# Patient Record
Sex: Female | Born: 1975 | Race: Black or African American | Hispanic: No | Marital: Married | Smoking: Never smoker
Health system: Southern US, Community
[De-identification: ages and names within clinical notes are randomized; demographics above are authoritative.]

## PROBLEM LIST (undated history)

## (undated) ENCOUNTER — Inpatient Hospital Stay (HOSPITAL_COMMUNITY): Payer: Self-pay

## (undated) DIAGNOSIS — O139 Gestational [pregnancy-induced] hypertension without significant proteinuria, unspecified trimester: Secondary | ICD-10-CM

## (undated) DIAGNOSIS — O09299 Supervision of pregnancy with other poor reproductive or obstetric history, unspecified trimester: Secondary | ICD-10-CM

## (undated) HISTORY — DX: Supervision of pregnancy with other poor reproductive or obstetric history, unspecified trimester: O09.299

## (undated) HISTORY — PX: NO PAST SURGERIES: SHX2092

---

## 2009-07-03 ENCOUNTER — Inpatient Hospital Stay (HOSPITAL_COMMUNITY): Admission: AD | Admit: 2009-07-03 | Discharge: 2009-07-09 | Payer: Self-pay | Admitting: Obstetrics

## 2009-07-04 ENCOUNTER — Encounter: Payer: Self-pay | Admitting: Obstetrics

## 2009-07-06 ENCOUNTER — Encounter (INDEPENDENT_AMBULATORY_CARE_PROVIDER_SITE_OTHER): Payer: Self-pay | Admitting: Obstetrics

## 2009-07-08 ENCOUNTER — Encounter: Payer: Self-pay | Admitting: Neurology

## 2009-07-11 ENCOUNTER — Encounter: Admission: RE | Admit: 2009-07-11 | Discharge: 2009-08-10 | Payer: Self-pay | Admitting: Obstetrics

## 2010-04-21 LAB — CREATININE CLEARANCE, URINE, 24 HOUR
Collection Interval-CRCL: 24 h
Creatinine Clearance: 171 mL/min — ABNORMAL HIGH (ref 75–115)
Creatinine, 24H Ur: 1645 mg/d (ref 700–1800)
Creatinine, Urine: 65.8 mg/dL
Creatinine: 0.67 mg/dL (ref 0.4–1.2)
Urine Total Volume-CRCL: 2500 mL

## 2010-04-21 LAB — COMPREHENSIVE METABOLIC PANEL WITH GFR
ALT: 12 U/L (ref 0–35)
ALT: 13 U/L (ref 0–35)
ALT: 16 U/L (ref 0–35)
AST: 18 U/L (ref 0–37)
AST: 19 U/L (ref 0–37)
AST: 21 U/L (ref 0–37)
Albumin: 2.4 g/dL — ABNORMAL LOW (ref 3.5–5.2)
Albumin: 2.4 g/dL — ABNORMAL LOW (ref 3.5–5.2)
Albumin: 2.5 g/dL — ABNORMAL LOW (ref 3.5–5.2)
Alkaline Phosphatase: 106 U/L (ref 39–117)
Alkaline Phosphatase: 113 U/L (ref 39–117)
Alkaline Phosphatase: 116 U/L (ref 39–117)
BUN: 5 mg/dL — ABNORMAL LOW (ref 6–23)
BUN: 6 mg/dL (ref 6–23)
BUN: 7 mg/dL (ref 6–23)
CO2: 20 meq/L (ref 19–32)
CO2: 21 meq/L (ref 19–32)
CO2: 26 meq/L (ref 19–32)
Calcium: 7.2 mg/dL — ABNORMAL LOW (ref 8.4–10.5)
Calcium: 8.2 mg/dL — ABNORMAL LOW (ref 8.4–10.5)
Calcium: 8.8 mg/dL (ref 8.4–10.5)
Chloride: 101 meq/L (ref 96–112)
Chloride: 104 meq/L (ref 96–112)
Chloride: 99 meq/L (ref 96–112)
Creatinine, Ser: 0.61 mg/dL (ref 0.4–1.2)
Creatinine, Ser: 0.61 mg/dL (ref 0.4–1.2)
Creatinine, Ser: 0.73 mg/dL (ref 0.4–1.2)
GFR calc non Af Amer: >60 mL/min
GFR calc non Af Amer: >60 mL/min
GFR calc non Af Amer: >60 mL/min
Glucose, Bld: 117 mg/dL — ABNORMAL HIGH (ref 70–99)
Glucose, Bld: 130 mg/dL — ABNORMAL HIGH (ref 70–99)
Glucose, Bld: 95 mg/dL (ref 70–99)
Potassium: 3.9 meq/L (ref 3.5–5.1)
Potassium: 3.9 meq/L (ref 3.5–5.1)
Potassium: 4 meq/L (ref 3.5–5.1)
Sodium: 129 meq/L — ABNORMAL LOW (ref 135–145)
Sodium: 131 meq/L — ABNORMAL LOW (ref 135–145)
Sodium: 135 meq/L (ref 135–145)
Total Bilirubin: 0.2 mg/dL — ABNORMAL LOW (ref 0.3–1.2)
Total Bilirubin: 0.2 mg/dL — ABNORMAL LOW (ref 0.3–1.2)
Total Bilirubin: 0.3 mg/dL (ref 0.3–1.2)
Total Protein: 5.6 g/dL — ABNORMAL LOW (ref 6.0–8.3)
Total Protein: 6 g/dL (ref 6.0–8.3)
Total Protein: 6.1 g/dL (ref 6.0–8.3)

## 2010-04-21 LAB — COMPREHENSIVE METABOLIC PANEL
ALT: 12 U/L (ref 0–35)
ALT: 14 U/L (ref 0–35)
ALT: 15 U/L (ref 0–35)
AST: 19 U/L (ref 0–37)
Albumin: 2.1 g/dL — ABNORMAL LOW (ref 3.5–5.2)
Albumin: 2.3 g/dL — ABNORMAL LOW (ref 3.5–5.2)
Alkaline Phosphatase: 87 U/L (ref 39–117)
Alkaline Phosphatase: 99 U/L (ref 39–117)
BUN: 6 mg/dL (ref 6–23)
CO2: 24 mEq/L (ref 19–32)
CO2: 25 mEq/L (ref 19–32)
Calcium: 7.8 mg/dL — ABNORMAL LOW (ref 8.4–10.5)
Calcium: 8.6 mg/dL (ref 8.4–10.5)
Chloride: 102 mEq/L (ref 96–112)
Chloride: 99 mEq/L (ref 96–112)
Creatinine, Ser: 0.72 mg/dL (ref 0.4–1.2)
GFR calc Af Amer: >60 mL/min (ref >60)
GFR calc Af Amer: >60 mL/min (ref >60)
GFR calc Af Amer: >60 mL/min (ref >60)
GFR calc non Af Amer: >60 mL/min (ref >60)
GFR calc non Af Amer: >60 mL/min (ref >60)
Glucose, Bld: 105 mg/dL — ABNORMAL HIGH (ref 70–99)
Glucose, Bld: 139 mg/dL — ABNORMAL HIGH (ref 70–99)
Potassium: 4.2 mEq/L (ref 3.5–5.1)
Sodium: 132 mEq/L — ABNORMAL LOW (ref 135–145)
Sodium: 135 mEq/L (ref 135–145)
Total Bilirubin: 0.1 mg/dL — ABNORMAL LOW (ref 0.3–1.2)
Total Bilirubin: 0.3 mg/dL (ref 0.3–1.2)
Total Protein: 6.1 g/dL (ref 6.0–8.3)

## 2010-04-21 LAB — PROTEIN, URINE, 24 HOUR
Collection Interval-UPROT: 24 h
Protein, 24H Urine: 6475 mg/d — ABNORMAL HIGH (ref 50–100)
Protein, Urine: 259 mg/dL
Urine Total Volume-UPROT: 2500 mL

## 2010-04-21 LAB — CBC
HCT: 33 % — ABNORMAL LOW (ref 36.0–46.0)
HCT: 35.6 % — ABNORMAL LOW (ref 36.0–46.0)
HCT: 36.6 % (ref 36.0–46.0)
HCT: 37.8 % (ref 36.0–46.0)
Hemoglobin: 11.6 g/dL — ABNORMAL LOW (ref 12.0–15.0)
Hemoglobin: 12.4 g/dL (ref 12.0–15.0)
MCHC: 33.6 g/dL (ref 30.0–36.0)
MCHC: 33.8 g/dL (ref 30.0–36.0)
MCHC: 33.9 g/dL (ref 30.0–36.0)
MCV: 83.3 fL (ref 78.0–100.0)
MCV: 83.5 fL (ref 78.0–100.0)
MCV: 83.6 fL (ref 78.0–100.0)
MCV: 83.9 fL (ref 78.0–100.0)
Platelets: 191 10*3/uL (ref 150–400)
Platelets: 218 10*3/uL (ref 150–400)
Platelets: 224 10*3/uL (ref 150–400)
Platelets: 249 K/uL (ref 150–400)
RBC: 3.95 MIL/uL (ref 3.87–5.11)
RBC: 4.01 MIL/uL (ref 3.87–5.11)
RBC: 4.28 MIL/uL (ref 3.87–5.11)
RBC: 4.36 MIL/uL (ref 3.87–5.11)
RDW: 14.9 % (ref 11.5–15.5)
RDW: 15.1 % (ref 11.5–15.5)
RDW: 15.4 % (ref 11.5–15.5)
RDW: 15.5 % (ref 11.5–15.5)
RDW: 15.6 % — ABNORMAL HIGH (ref 11.5–15.5)
WBC: 13.9 10*3/uL — ABNORMAL HIGH (ref 4.0–10.5)
WBC: 8.7 K/uL (ref 4.0–10.5)

## 2010-04-21 LAB — LACTATE DEHYDROGENASE
LDH: 143 U/L (ref 94–250)
LDH: 160 U/L (ref 94–250)
LDH: 171 U/L (ref 94–250)
LDH: 178 U/L (ref 94–250)
LDH: 180 U/L (ref 94–250)

## 2010-04-21 LAB — URIC ACID
Uric Acid, Serum: 6.4 mg/dL (ref 2.4–7.0)
Uric Acid, Serum: 6.5 mg/dL (ref 2.4–7.0)
Uric Acid, Serum: 6.6 mg/dL (ref 2.4–7.0)
Uric Acid, Serum: 6.8 mg/dL (ref 2.4–7.0)
Uric Acid, Serum: 7.1 mg/dL — ABNORMAL HIGH (ref 2.4–7.0)

## 2010-04-21 LAB — MAGNESIUM
Magnesium: 5.7 mg/dL — ABNORMAL HIGH (ref 1.5–2.5)
Magnesium: 6.8 mg/dL (ref 1.5–2.5)

## 2010-04-21 LAB — GLUCOSE, CAPILLARY: Glucose-Capillary: 138 mg/dL — ABNORMAL HIGH (ref 70–99)

## 2010-05-21 ENCOUNTER — Ambulatory Visit (HOSPITAL_COMMUNITY)
Admission: RE | Admit: 2010-05-21 | Discharge: 2010-05-21 | Disposition: A | Discharge status: Home / Self Care | Payer: Self-pay | Source: Ambulatory Visit | Attending: Internal Medicine | Admitting: Internal Medicine

## 2010-05-21 ENCOUNTER — Other Ambulatory Visit (HOSPITAL_COMMUNITY): Payer: Self-pay | Admitting: Internal Medicine

## 2010-05-21 DIAGNOSIS — M25569 Pain in unspecified knee: Secondary | ICD-10-CM

## 2010-06-04 ENCOUNTER — Ambulatory Visit: Payer: Self-pay | Attending: Internal Medicine | Admitting: Physical Therapy

## 2010-06-04 DIAGNOSIS — IMO0001 Reserved for inherently not codable concepts without codable children: Secondary | ICD-10-CM | POA: Insufficient documentation

## 2010-06-04 DIAGNOSIS — M25669 Stiffness of unspecified knee, not elsewhere classified: Secondary | ICD-10-CM | POA: Insufficient documentation

## 2010-06-04 DIAGNOSIS — M25569 Pain in unspecified knee: Secondary | ICD-10-CM | POA: Insufficient documentation

## 2010-06-13 ENCOUNTER — Ambulatory Visit: Payer: Self-pay | Admitting: Physical Therapy

## 2010-06-18 ENCOUNTER — Ambulatory Visit: Payer: Self-pay | Admitting: Physical Therapy

## 2010-07-23 ENCOUNTER — Ambulatory Visit: Payer: Self-pay | Admitting: Physical Therapy

## 2010-07-25 ENCOUNTER — Other Ambulatory Visit (HOSPITAL_COMMUNITY): Payer: Self-pay | Admitting: Neurosurgery

## 2010-07-25 ENCOUNTER — Ambulatory Visit (HOSPITAL_COMMUNITY)
Admission: RE | Admit: 2010-07-25 | Discharge: 2010-07-25 | Disposition: A | Discharge status: Home / Self Care | Payer: Self-pay | Source: Ambulatory Visit | Attending: Neurosurgery | Admitting: Neurosurgery

## 2010-07-25 DIAGNOSIS — R52 Pain, unspecified: Secondary | ICD-10-CM

## 2010-07-25 DIAGNOSIS — M47817 Spondylosis without myelopathy or radiculopathy, lumbosacral region: Secondary | ICD-10-CM | POA: Insufficient documentation

## 2010-07-25 DIAGNOSIS — M545 Low back pain, unspecified: Secondary | ICD-10-CM | POA: Insufficient documentation

## 2011-01-19 ENCOUNTER — Other Ambulatory Visit: Payer: Self-pay | Admitting: Family Medicine

## 2011-05-18 IMAGING — US US OB COMP +14 WK
1 series · 14 of 28 positions shown · non-contrast
Comparison: none

OBSTETRICAL ULTRASOUND:
 This ultrasound was performed in The [HOSPITAL], and the [facility] report will be stored to [REDACTED] PACS.  This report is also available in [HOSPITAL]?s accessANYware.

[Series 1: us ob comp +14 wk · 14 of 39 slices shown]
[im 2/39]
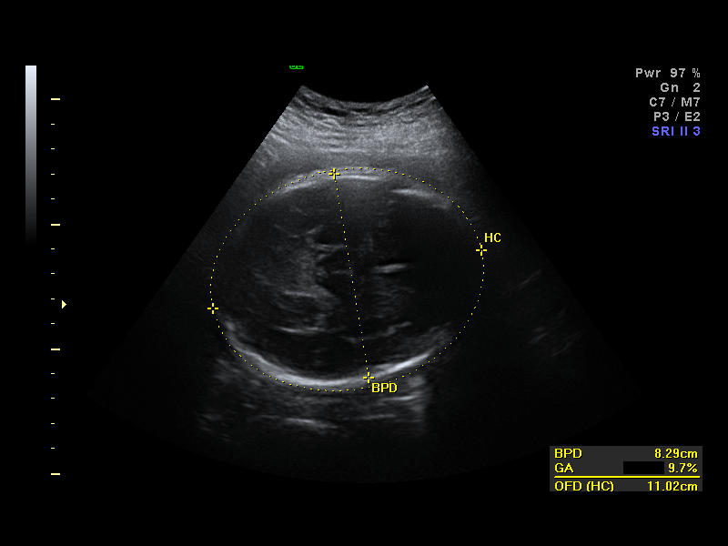
[im 5/39]
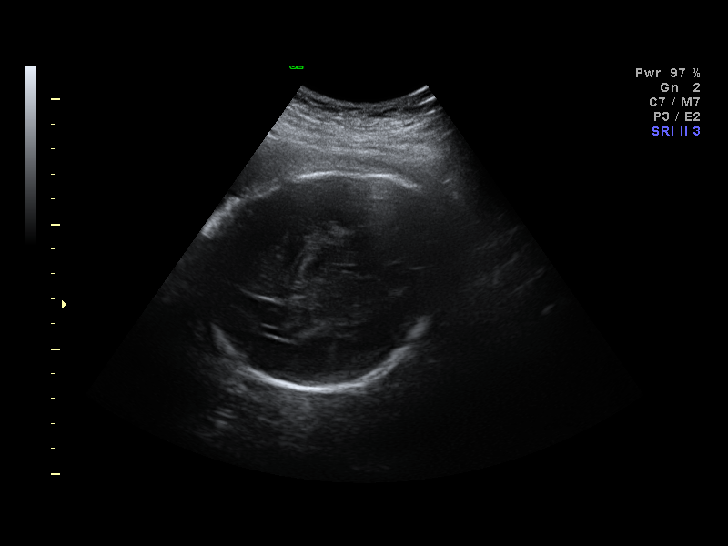
[im 8/39]
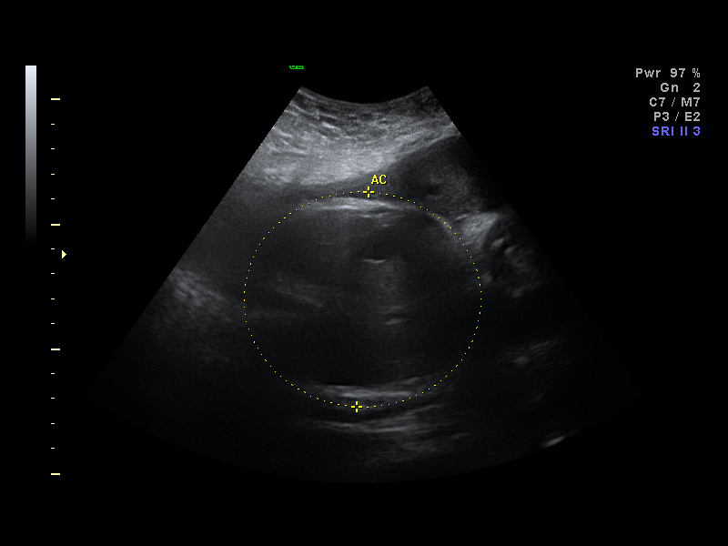
[im 10/39]
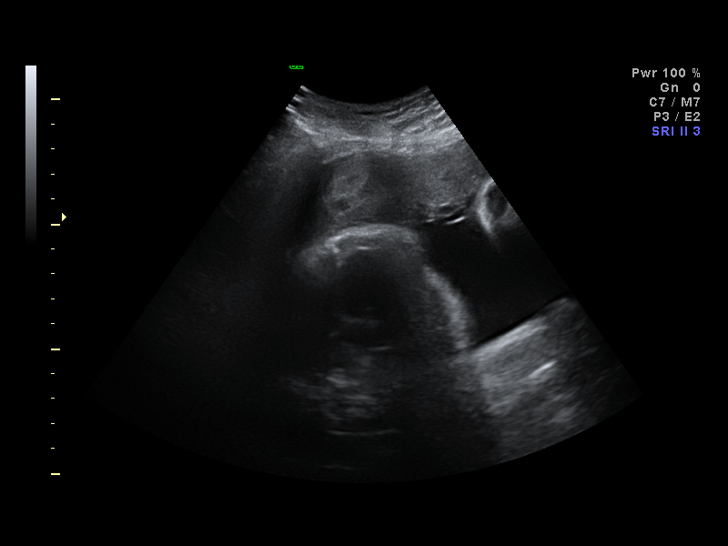
[im 13/39]
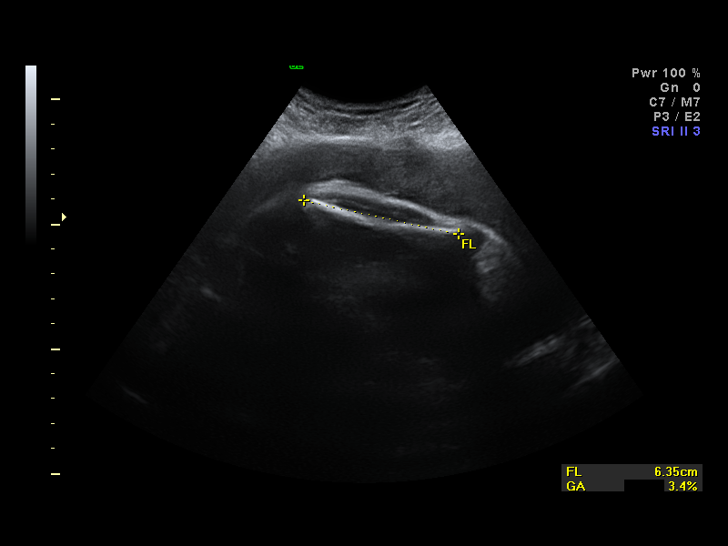
[im 16/39]
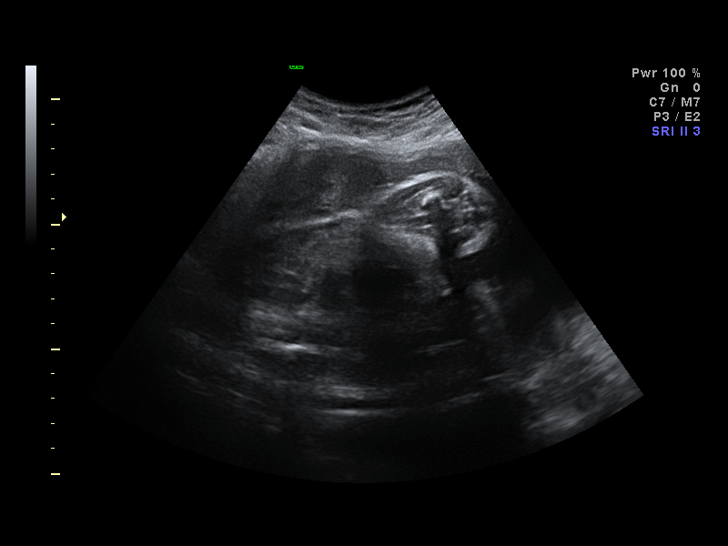
[im 19/39]
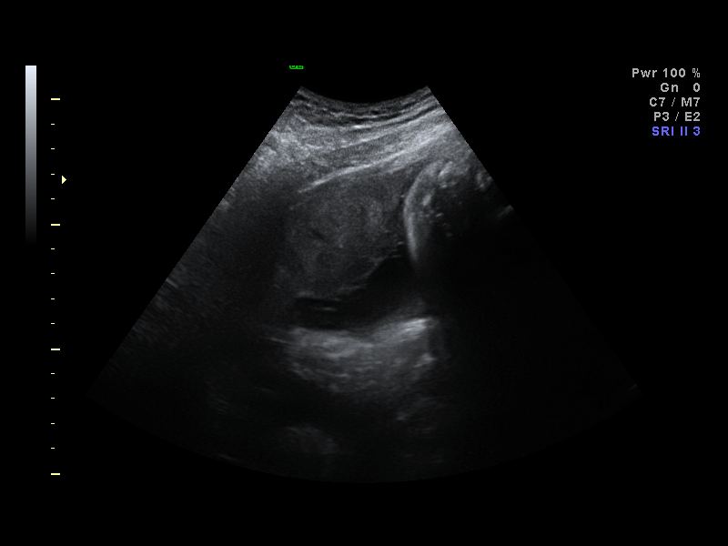
[im 22/39]
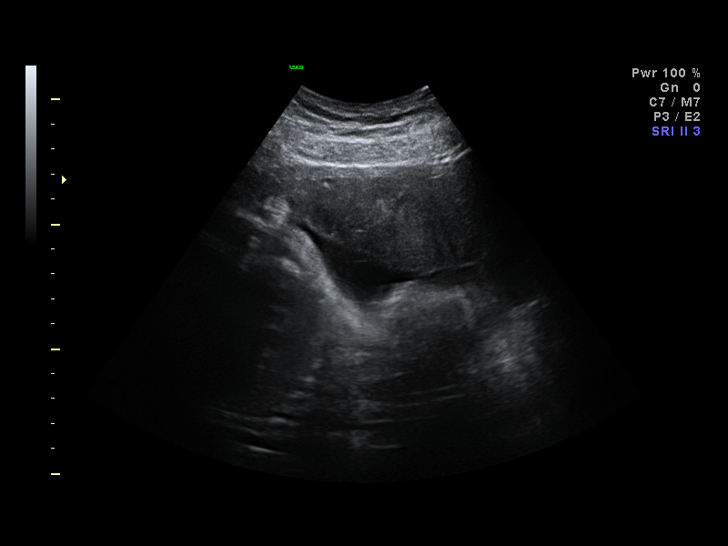
[im 24/39]
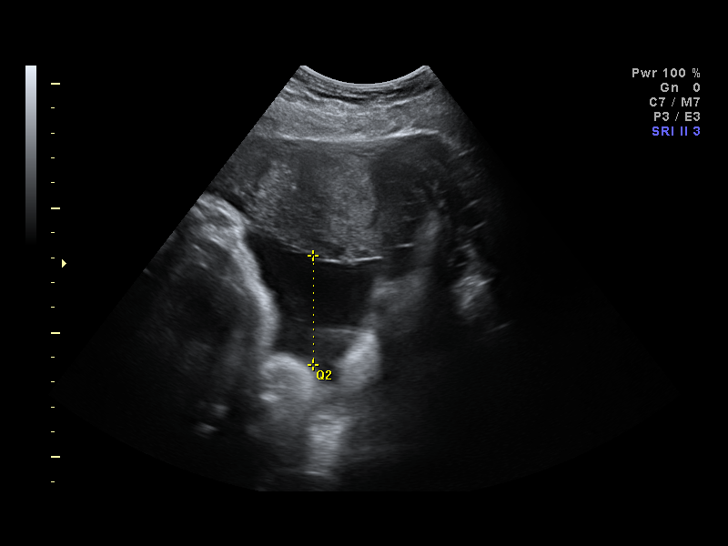
[im 27/39]
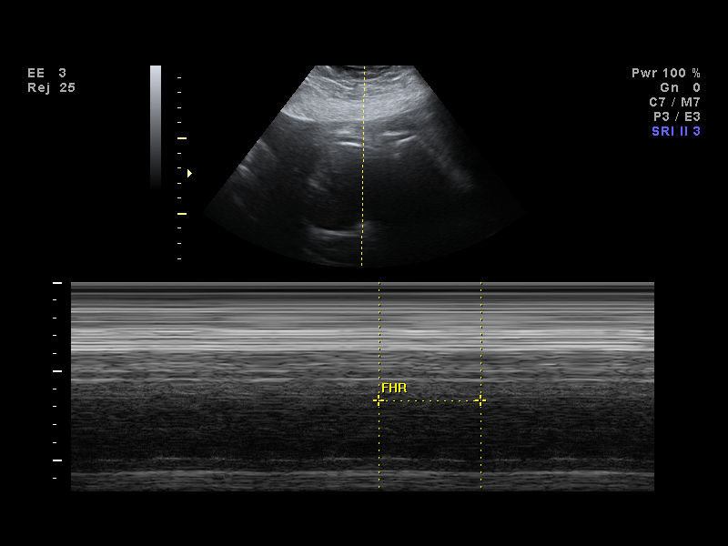
[im 30/39]
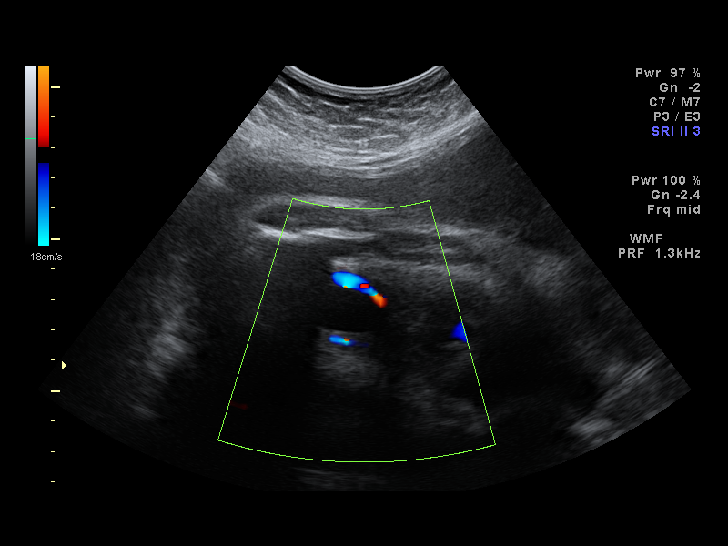
[im 33/39]
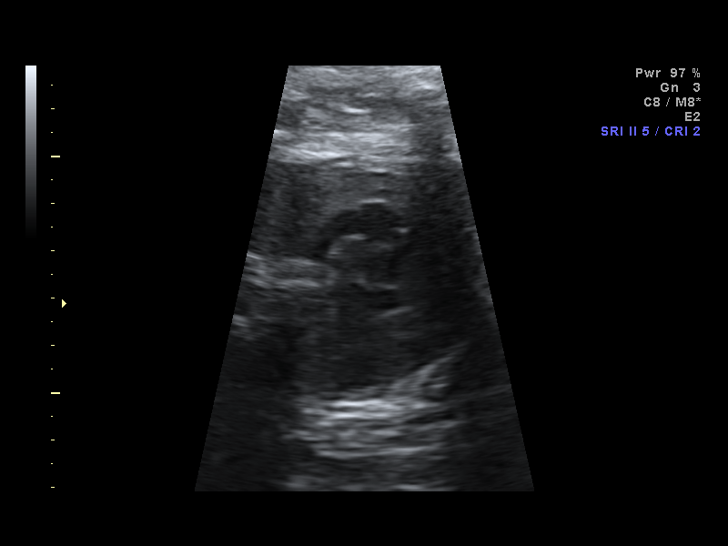
[im 36/39]
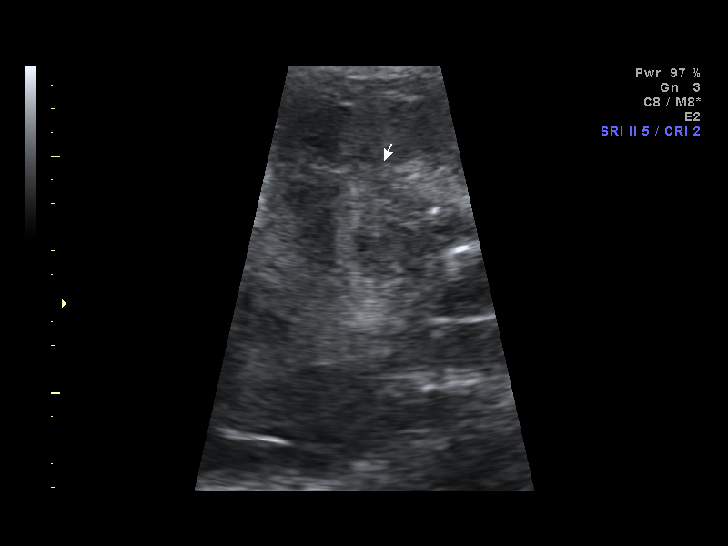
[im 39/39]
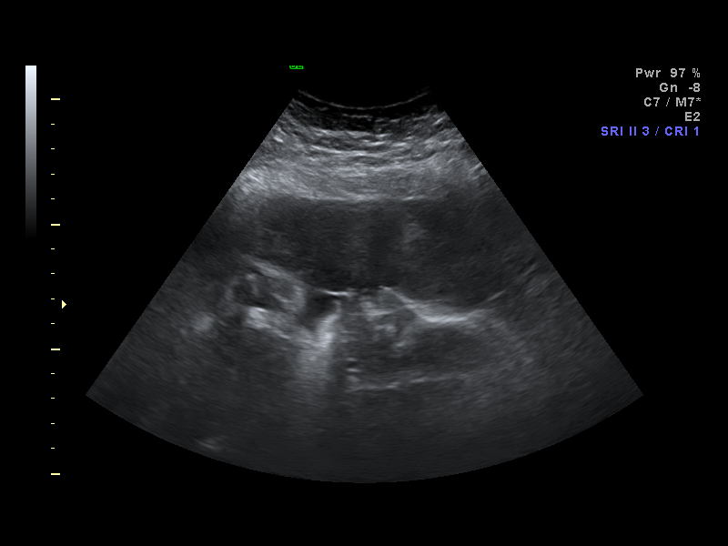

[14 of 28 positions shown; findings below may reference images not displayed]

IMPRESSION: [facility] has also been faxed to the ordering physician.

## 2011-12-14 ENCOUNTER — Emergency Department (HOSPITAL_COMMUNITY)
Admission: EM | Admit: 2011-12-14 | Discharge: 2011-12-14 | Disposition: A | Discharge status: Home / Self Care | Payer: Self-pay | Attending: Emergency Medicine | Admitting: Emergency Medicine

## 2011-12-14 ENCOUNTER — Emergency Department (HOSPITAL_COMMUNITY): Payer: Self-pay

## 2011-12-14 ENCOUNTER — Encounter (HOSPITAL_COMMUNITY): Payer: Self-pay | Admitting: Family Medicine

## 2011-12-14 DIAGNOSIS — S161XXA Strain of muscle, fascia and tendon at neck level, initial encounter: Secondary | ICD-10-CM

## 2011-12-14 DIAGNOSIS — S139XXA Sprain of joints and ligaments of unspecified parts of neck, initial encounter: Secondary | ICD-10-CM | POA: Insufficient documentation

## 2011-12-14 DIAGNOSIS — Y9389 Activity, other specified: Secondary | ICD-10-CM | POA: Insufficient documentation

## 2011-12-14 MED ORDER — HYDROCODONE-ACETAMINOPHEN 5-325 MG PO TABS: 1.0000 | ORAL_TABLET | ORAL | Status: DC | PRN

## 2011-12-14 MED ORDER — METHOCARBAMOL 500 MG PO TABS: 500.0000 mg | ORAL_TABLET | Freq: Two times a day (BID) | ORAL | Status: DC

## 2011-12-14 MED ORDER — IBUPROFEN 600 MG PO TABS: 600.0000 mg | ORAL_TABLET | Freq: Four times a day (QID) | ORAL | Status: DC | PRN

## 2011-12-14 MED ORDER — METHOCARBAMOL 500 MG PO TABS
1000.0000 mg | ORAL_TABLET | Freq: Once | ORAL | Status: AC
  Administered 2011-12-14: 1000 mg via ORAL
  Filled 2011-12-14: qty 2

## 2011-12-14 MED ORDER — KETOROLAC TROMETHAMINE 60 MG/2ML IM SOLN
60.0000 mg | Freq: Once | INTRAMUSCULAR | Status: AC
  Administered 2011-12-14: 60 mg via INTRAMUSCULAR
  Filled 2011-12-14: qty 2
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
## 2011-12-14 NOTE — ED Notes (Signed)
To x-ray
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
## 2012-01-18 ENCOUNTER — Other Ambulatory Visit: Payer: Self-pay | Admitting: Infectious Diseases

## 2012-01-18 ENCOUNTER — Ambulatory Visit
Admission: RE | Admit: 2012-01-18 | Discharge: 2012-01-18 | Disposition: A | Discharge status: Home / Self Care | Payer: No Typology Code available for payment source | Source: Ambulatory Visit | Attending: Infectious Diseases | Admitting: Infectious Diseases

## 2012-01-18 DIAGNOSIS — R7611 Nonspecific reaction to tuberculin skin test without active tuberculosis: Secondary | ICD-10-CM

## 2012-07-14 ENCOUNTER — Other Ambulatory Visit (HOSPITAL_COMMUNITY)
Admission: RE | Admit: 2012-07-14 | Discharge: 2012-07-14 | Disposition: A | Discharge status: Home / Self Care | Payer: BC Managed Care – PPO | Source: Ambulatory Visit | Attending: Family Medicine | Admitting: Family Medicine

## 2012-07-14 ENCOUNTER — Other Ambulatory Visit: Payer: Self-pay | Admitting: Physician Assistant

## 2012-07-14 DIAGNOSIS — Z124 Encounter for screening for malignant neoplasm of cervix: Secondary | ICD-10-CM | POA: Insufficient documentation

## 2014-02-05 ENCOUNTER — Other Ambulatory Visit: Payer: Self-pay | Admitting: Physician Assistant

## 2014-02-05 ENCOUNTER — Other Ambulatory Visit (HOSPITAL_COMMUNITY)
Admission: RE | Admit: 2014-02-05 | Discharge: 2014-02-05 | Disposition: A | Discharge status: Home / Self Care | Payer: 59 | Source: Ambulatory Visit | Attending: Family Medicine | Admitting: Family Medicine

## 2014-02-05 DIAGNOSIS — Z124 Encounter for screening for malignant neoplasm of cervix: Secondary | ICD-10-CM | POA: Insufficient documentation

## 2014-02-06 LAB — CYTOLOGY - PAP

## 2014-10-10 ENCOUNTER — Other Ambulatory Visit: Payer: Self-pay | Admitting: Sports Medicine

## 2014-10-10 DIAGNOSIS — M545 Low back pain: Secondary | ICD-10-CM

## 2014-10-20 ENCOUNTER — Ambulatory Visit
Admission: RE | Admit: 2014-10-20 | Discharge: 2014-10-20 | Disposition: A | Discharge status: Home / Self Care | Payer: 59 | Source: Ambulatory Visit | Attending: Sports Medicine | Admitting: Sports Medicine

## 2014-10-20 DIAGNOSIS — M545 Low back pain: Secondary | ICD-10-CM

## 2015-01-30 ENCOUNTER — Other Ambulatory Visit (HOSPITAL_COMMUNITY): Payer: Self-pay | Admitting: Obstetrics & Gynecology

## 2015-01-30 DIAGNOSIS — N979 Female infertility, unspecified: Secondary | ICD-10-CM

## 2015-02-06 ENCOUNTER — Ambulatory Visit (HOSPITAL_COMMUNITY)
Admission: RE | Admit: 2015-02-06 | Discharge: 2015-02-06 | Disposition: A | Discharge status: Home / Self Care | Payer: BLUE CROSS/BLUE SHIELD | Source: Ambulatory Visit | Attending: Obstetrics & Gynecology | Admitting: Obstetrics & Gynecology

## 2015-02-06 DIAGNOSIS — N979 Female infertility, unspecified: Secondary | ICD-10-CM | POA: Diagnosis not present

## 2015-02-06 MED ORDER — IOHEXOL 300 MG/ML  SOLN
30.0000 mL | Freq: Once | INTRAMUSCULAR | Status: AC | PRN
Start: 2015-02-06 — End: 2015-02-06
  Administered 2015-02-06: 30 mL

## 2016-10-19 LAB — OB RESULTS CONSOLE HEPATITIS B SURFACE ANTIGEN: Hepatitis B Surface Ag: NEGATIVE

## 2016-10-19 LAB — OB RESULTS CONSOLE ABO/RH: RH TYPE: POSITIVE

## 2016-10-19 LAB — OB RESULTS CONSOLE ANTIBODY SCREEN: Antibody Screen: NEGATIVE

## 2016-10-19 LAB — OB RESULTS CONSOLE RUBELLA ANTIBODY, IGM: Rubella: IMMUNE

## 2016-10-19 LAB — OB RESULTS CONSOLE HIV ANTIBODY (ROUTINE TESTING): HIV: NONREACTIVE

## 2016-10-19 LAB — OB RESULTS CONSOLE RPR: RPR: NONREACTIVE

## 2016-10-21 ENCOUNTER — Encounter (HOSPITAL_COMMUNITY): Payer: Self-pay

## 2016-11-03 LAB — OB RESULTS CONSOLE GC/CHLAMYDIA
Chlamydia: NEGATIVE
GC PROBE AMP, GENITAL: NEGATIVE

## 2016-11-25 ENCOUNTER — Encounter (HOSPITAL_COMMUNITY): Payer: Self-pay | Admitting: *Deleted

## 2016-11-25 ENCOUNTER — Inpatient Hospital Stay (HOSPITAL_COMMUNITY)
Admission: AD | Admit: 2016-11-25 | Discharge: 2016-11-25 | Disposition: A | Discharge status: Home / Self Care | Payer: BLUE CROSS/BLUE SHIELD | Source: Ambulatory Visit | Attending: Obstetrics & Gynecology | Admitting: Obstetrics & Gynecology

## 2016-11-25 DIAGNOSIS — Z3A13 13 weeks gestation of pregnancy: Secondary | ICD-10-CM | POA: Diagnosis not present

## 2016-11-25 DIAGNOSIS — O26891 Other specified pregnancy related conditions, first trimester: Secondary | ICD-10-CM | POA: Diagnosis not present

## 2016-11-25 DIAGNOSIS — O99611 Diseases of the digestive system complicating pregnancy, first trimester: Secondary | ICD-10-CM | POA: Diagnosis not present

## 2016-11-25 DIAGNOSIS — O210 Mild hyperemesis gravidarum: Secondary | ICD-10-CM | POA: Insufficient documentation

## 2016-11-25 DIAGNOSIS — K117 Disturbances of salivary secretion: Secondary | ICD-10-CM | POA: Diagnosis not present

## 2016-11-25 DIAGNOSIS — O99281 Endocrine, nutritional and metabolic diseases complicating pregnancy, first trimester: Secondary | ICD-10-CM | POA: Diagnosis not present

## 2016-11-25 DIAGNOSIS — R109 Unspecified abdominal pain: Secondary | ICD-10-CM

## 2016-11-25 DIAGNOSIS — E876 Hypokalemia: Secondary | ICD-10-CM | POA: Insufficient documentation

## 2016-11-25 DIAGNOSIS — O219 Vomiting of pregnancy, unspecified: Secondary | ICD-10-CM | POA: Diagnosis not present

## 2016-11-25 LAB — CBC
HCT: 37.1 % (ref 36.0–46.0)
Hemoglobin: 12.6 g/dL (ref 12.0–15.0)
MCH: 26.9 pg (ref 26.0–34.0)
MCHC: 34 g/dL (ref 30.0–36.0)
MCV: 79.1 fL (ref 78.0–100.0)
PLATELETS: 291 10*3/uL (ref 150–400)
RBC: 4.69 MIL/uL (ref 3.87–5.11)
RDW: 13.2 % (ref 11.5–15.5)
WBC: 5.3 10*3/uL (ref 4.0–10.5)

## 2016-11-25 LAB — COMPREHENSIVE METABOLIC PANEL
ALBUMIN: 3.4 g/dL — AB (ref 3.5–5.0)
ALK PHOS: 36 U/L — AB (ref 38–126)
ALT: 22 U/L (ref 14–54)
ANION GAP: 9 (ref 5–15)
AST: 24 U/L (ref 15–41)
BILIRUBIN TOTAL: 0.4 mg/dL (ref 0.3–1.2)
CALCIUM: 8.9 mg/dL (ref 8.9–10.3)
CO2: 24 mmol/L (ref 22–32)
Chloride: 100 mmol/L — ABNORMAL LOW (ref 101–111)
Creatinine, Ser: 0.64 mg/dL (ref 0.44–1.00)
GFR calc Af Amer: >60 mL/min (ref >60)
GFR calc non Af Amer: >60 mL/min (ref >60)
GLUCOSE: 99 mg/dL (ref 65–99)
Potassium: 3.3 mmol/L — ABNORMAL LOW (ref 3.5–5.1)
Sodium: 133 mmol/L — ABNORMAL LOW (ref 135–145)
TOTAL PROTEIN: 7 g/dL (ref 6.5–8.1)

## 2016-11-25 LAB — WET PREP, GENITAL
Sperm: NONE SEEN
Trich, Wet Prep: NONE SEEN
Yeast Wet Prep HPF POC: NONE SEEN

## 2016-11-25 LAB — URINALYSIS, ROUTINE W REFLEX MICROSCOPIC
BACTERIA UA: NONE SEEN
BILIRUBIN URINE: NEGATIVE
Glucose, UA: 50 mg/dL — AB
HGB URINE DIPSTICK: NEGATIVE
Ketones, ur: NEGATIVE mg/dL
Leukocytes, UA: NEGATIVE
NITRITE: NEGATIVE
PROTEIN: 30 mg/dL — AB
RBC / HPF: NONE SEEN RBC/hpf (ref 0–5)
SPECIFIC GRAVITY, URINE: 1.018 (ref 1.005–1.030)
Squamous Epithelial / LPF: NONE SEEN
WBC UA: NONE SEEN WBC/hpf (ref 0–5)
pH: 6 (ref 5.0–8.0)

## 2016-11-25 MED ORDER — LACTATED RINGERS IV BOLUS (SEPSIS)
1000.0000 mL | Freq: Once | INTRAVENOUS | Status: AC
  Administered 2016-11-25: 1000 mL via INTRAVENOUS

## 2016-11-25 MED ORDER — GLYCOPYRROLATE 0.2 MG/ML IJ SOLN
0.1000 mg | Freq: Once | INTRAMUSCULAR | Status: AC
  Administered 2016-11-25: 0.1 mg via INTRAVENOUS
  Filled 2016-11-25: qty 0.5

## 2016-11-25 MED ORDER — ONDANSETRON HCL 40 MG/20ML IJ SOLN
8.0000 mg | Freq: Once | INTRAMUSCULAR | Status: AC
  Administered 2016-11-25: 8 mg via INTRAVENOUS
  Filled 2016-11-25: qty 4

## 2016-11-25 MED ORDER — FAMOTIDINE 20 MG PO TABS: 20.0000 mg | ORAL_TABLET | Freq: Every day | ORAL | 1 refills | Status: DC

## 2016-11-25 MED ORDER — FAMOTIDINE IN NACL 20-0.9 MG/50ML-% IV SOLN
20.0000 mg | Freq: Once | INTRAVENOUS | Status: AC
  Administered 2016-11-25: 20 mg via INTRAVENOUS
  Filled 2016-11-25: qty 50

## 2016-11-25 MED ORDER — M.V.I. ADULT IV INJ
Freq: Once | INTRAVENOUS | Status: AC
  Administered 2016-11-25: 16:00:00 via INTRAVENOUS
  Filled 2016-11-25: qty 10

## 2016-11-25 MED ORDER — GLYCOPYRROLATE 1 MG PO TABS: 1.0000 mg | ORAL_TABLET | Freq: Three times a day (TID) | ORAL | 1 refills | Status: DC

## 2016-11-25 NOTE — Progress Notes (Addendum)
G3P1 @ 13.[redacted] wksga. Presents to triage for abd pain that started this morning. Denies bleeding. Current pregn excessive salivation includes past pregn. PC/s for preE  Doppler 154-159.   1439: ordered for LR bolus and labs. Ordered for blind swabs as well.   [id number]: Iv and labs done.  1500: Wet prep and GC collected.   1552: up to bathroom. LR still going.   1605: Mull[place] vit up per order.

## 2016-11-25 NOTE — MAU Note (Signed)
Has been throwing up, nothing really stays down.  Dr told her to come  In.  This morning started having pain in lower abd.  spitting

## 2016-11-25 NOTE — Discharge Instructions (Signed)
Eating Plan for Hyperemesis Gravidarum °Hyperemesis gravidarum is a severe form of morning sickness. Because this condition causes severe nausea and vomiting, it can lead to dehydration, malnutrition, and weight loss. One way to lessen the symptoms of nausea and vomiting is to follow the eating plan for hyperemesis gravidarum. It is often used along with prescribed medicines to control your symptoms. °What can I do to relieve my symptoms? °Listen to your body. Everyone is different and has different preferences. Find what works best for you. Take any of the following actions that are helpful to you: °· Eat and drink slowly. °· Eat 5-6 small meals daily instead of 3 large meals. °· Eat crackers before you get out of bed in the morning. °· Try having a snack in the middle of the night. °· Starchy foods are usually tolerated well. Examples include cereal, toast, bread, potatoes, pasta, rice, and pretzels. °· Ginger may help with nausea. Add ¼ tsp ground ginger to hot tea or choose ginger tea. °· Try drinking 100% fruit juice or an electrolyte drink. An electrolyte drink contains sodium, potassium, and chloride. °· Continue to take your prenatal vitamins as told by your health care provider. If you are having trouble taking your prenatal vitamins, talk with your health care provider about different options. °· Include at least 1 serving of protein with your meals and snacks. Protein options include meats or poultry, beans, nuts, eggs, and yogurt. Try eating a protein-rich snack before bed. Examples of these snacks include cheese and crackers or half of a peanut butter or turkey sandwich. °· Consider eliminating foods that trigger your symptoms. These may include spicy foods, coffee, high-fat foods, very sweet foods, and acidic foods. °· Try meals that have more protein combined with bland, salty, lower-fat, and dry foods, such as nuts, seeds, pretzels, crackers, and cereal. °· Talk with your healthcare provider about  starting a supplement of vitamin B6. °· Have fluids that are cold, clear, and carbonated or sour. Examples include lemonade, ginger ale, lemon-lime soda, ice water, and sparkling water. °· Try lemon or mint tea. °· Try brushing your teeth or using a mouth rinse after meals. ° °What should I avoid to reduce my symptoms? °Avoiding some of the following things may help reduce your symptoms. °· Foods with strong smells. Try eating meals in well-ventilated areas that are free of odors. °· Drinking water or other beverages with meals. Try not to drink anything during the 30 minutes before and after your meals. °· Drinking more than 1 cup of fluid at a time. Sometimes using a straw helps. °· Fried or high-fat foods, such as butter and cream sauces. °· Spicy foods. °· Skipping meals as best as you can. Nausea can be more intense on an empty stomach. If you cannot tolerate food at that time, do not force it. Try sucking on ice chips or other frozen items, and make up for missed calories later. °· Lying down within 2 hours after eating. °· Environmental triggers. These may include smoky rooms, closed spaces, rooms with strong smells, warm or humid places, overly loud and noisy rooms, and rooms with motion or flickering lights. °· Quick and sudden changes in your movement. ° °This information is not intended to replace advice given to you by your health care provider. Make sure you discuss any questions you have with your health care provider. °Document Released: 11/16/2006 Document Revised: 09/18/2015 Document Reviewed: 08/20/2015 °Elsevier Interactive Patient Education © 2018 [employer] °Morning Sickness °Morning sickness   is when you feel sick to your stomach (nauseous) during pregnancy. You may feel sick to your stomach and throw up (vomit). You may feel sick in the morning, but you can feel this way any time of day. Some women feel very sick to their stomach and cannot stop throwing up (hyperemesis gravidarum). °Follow  these instructions at home: °· Only take medicines as told by your doctor. °· Take multivitamins as told by your doctor. Taking multivitamins before getting pregnant can stop or lessen the harshness of morning sickness. °· Eat dry toast or unsalted crackers before getting out of bed. °· Eat 5 to 6 small meals a day. °· Eat dry and bland foods like rice and baked potatoes. °· Do not drink liquids with meals. Drink between meals. °· Do not eat greasy, fatty, or spicy foods. °· Have someone cook for you if the smell of food causes you to feel sick or throw up. °· If you feel sick to your stomach after taking prenatal vitamins, take them at night or with a snack. °· Eat protein when you need a snack (nuts, yogurt, cheese). °· Eat unsweetened gelatins for dessert. °· Wear a bracelet used for sea sickness (acupressure wristband). °· Go to a doctor that puts thin needles into certain body points (acupuncture) to improve how you feel. °· Do not smoke. °· Use a humidifier to keep the air in your house free of odors. °· Get lots of fresh air. °Contact a doctor if: °· You need medicine to feel better. °· You feel dizzy or lightheaded. °· You are losing weight. °Get help right away if: °· You feel very sick to your stomach and cannot stop throwing up. °· You pass out (faint). °This information is not intended to replace advice given to you by your health care provider. Make sure you discuss any questions you have with your health care provider. °Document Released: 02/27/2004 Document Revised: 06/27/2015 Document Reviewed: 07/06/2012 °Elsevier Interactive Patient Education © 2017 Elsevier Inc. ° °

## 2016-11-25 NOTE — MAU Provider Note (Signed)
History     CSN: [id number]  Arrival date and time: 11/25/16 1245   First Provider Initiated Contact with Patient 11/25/16 1423      Chief Complaint  Patient presents with  . Nausea  . Emesis  . Abdominal Pain   G3P1011 @13 .5 wks here with LAP and N/V. LAP started this am. Occurs with walking. Describes as pulling in bilateral lower abdomen. Denies fever and chills. No urinary sx. Reports some vaginal odor. No vaginal bleeding. N/V is an ongoing problem. She reports a 20 lb weight loss this pregnancy. She was given [place] but states it didn't help. She was given Rx for Zofran but did not pick it up because "I took many meds in my first pregnancy and nothing helped". She also reports excessive salivation and spits into bottle all day, she was told there is nothing to take for it.   OB History    Gravida Para Term Preterm AB Living   3 1     1 1    SAB TAB Ectopic Multiple Live Births                  Past Medical History:  Diagnosis Date  . Medical history non-contributory     Past Surgical History:  Procedure Laterality Date  . NO PAST SURGERIES      Family History  Problem Relation Age of Onset  . Asthma Mother   . Hypertension Mother   . Miscarriages / [place] Mother   . Hypertension Father   . Vision loss Father   . Stroke Maternal Grandfather     Social History  Substance Use Topics  . Smoking status: Never Smoker  . Smokeless tobacco: Former Neurosurgeon  . Alcohol use Yes    Allergies: No Known Allergies  No prescriptions prior to admission.    Review of Systems  Constitutional: Negative for chills and fever.  Gastrointestinal: Positive for abdominal pain, constipation, nausea and vomiting.  Genitourinary: Negative for dysuria and vaginal bleeding.   Physical Exam   Blood pressure 122/70, pulse (!) 112, temperature 98.4 F (36.9 C), temperature source Oral, resp. rate 18, weight 297 lb 8 oz (134.9 kg), SpO2 100 %.  Physical Exam   Constitutional: She is oriented to person, place, and time. She appears well-developed and well-nourished. No distress.  HENT:  Head: Normocephalic and atraumatic.  Neck: Normal range of motion.  Respiratory: Effort normal. No respiratory distress.  GI: She exhibits no distension and no mass. There is tenderness in the suprapubic area. There is no rebound and no guarding.  Musculoskeletal: Normal range of motion.  Neurological: She is alert and oriented to person, place, and time.  Skin: Skin is warm and dry.  Psychiatric: She has a normal mood and affect.  FHT: 154   Results for orders placed or performed during the hospital encounter of 11/25/16 (from the past 24 hour(s))  Urinalysis, Routine w reflex microscopic     Status: Abnormal   Collection Time: 11/25/16  1:08 PM  Result Value Ref Range   Color, Urine YELLOW YELLOW   APPearance HAZY (A) CLEAR   Specific Gravity, Urine 1.018 1.005 - 1.030   pH 6.0 5.0 - 8.0   Glucose, UA 50 (A) NEGATIVE mg/dL   Hgb urine dipstick NEGATIVE NEGATIVE   Bilirubin Urine NEGATIVE NEGATIVE   Ketones, ur NEGATIVE NEGATIVE mg/dL   Protein, ur 30 (A) NEGATIVE mg/dL   Nitrite NEGATIVE NEGATIVE   Leukocytes, UA NEGATIVE NEGATIVE   RBC /  HPF NONE SEEN 0 - 5 RBC/hpf   WBC, UA NONE SEEN 0 - 5 WBC/hpf   Bacteria, UA NONE SEEN NONE SEEN   Squamous Epithelial / LPF NONE SEEN NONE SEEN  CBC     Status: None   Collection Time: 11/25/16  2:49 PM  Result Value Ref Range   WBC 5.3 4.0 - 10.5 K/uL   RBC 4.69 3.87 - 5.11 MIL/uL   Hemoglobin 12.6 12.0 - 15.0 g/dL   HCT 78.237.1 95.636.0 - 21.346.0 %   MCV 79.1 78.0 - 100.0 fL   MCH 26.9 26.0 - 34.0 pg   MCHC 34.0 30.0 - 36.0 g/dL   RDW 08.613.2 57.811.5 - 46.915.5 %   Platelets 291 150 - 400 K/uL  Wet prep, genital     Status: Abnormal   Collection Time: 11/25/16  3:01 PM  Result Value Ref Range   Yeast Wet Prep HPF POC NONE SEEN NONE SEEN   Trich, Wet Prep NONE SEEN NONE SEEN   Clue Cells Wet Prep HPF POC PRESENT (A) NONE  SEEN   WBC, Wet Prep HPF POC MODERATE (A) NONE SEEN   Sperm NONE SEEN   Comprehensive metabolic panel     Status: Abnormal   Collection Time: 11/25/16  3:34 PM  Result Value Ref Range   Sodium 133 (L) 135 - 145 mmol/L   Potassium 3.3 (L) 3.5 - 5.1 mmol/L   Chloride 100 (L) 101 - 111 mmol/L   CO2 24 22 - 32 mmol/L   Glucose, Bld 99 65 - 99 mg/dL   BUN <5 (L) 6 - 20 mg/dL   Creatinine, Ser 6.290.64 0.44 - 1.00 mg/dL   Calcium 8.9 8.9 - 52.810.3 mg/dL   Total Protein 7.0 6.5 - 8.1 g/dL   Albumin 3.4 (L) 3.5 - 5.0 g/dL   AST 24 15 - 41 U/L   ALT 22 14 - 54 U/L   Alkaline Phosphatase 36 (L) 38 - 126 U/L   Total Bilirubin 0.4 0.3 - 1.2 mg/dL   GFR calc non Af Amer >60 >60 mL/min   GFR calc Af Amer >60 >60 mL/min   Anion gap 9 5 - 15   MAU Course  Procedures LR Zofran Robinul Pepcid  MDM Labs ordered and reviewed. Nausea and Ptyalism much improved. No episodes of emesis. Tolerating po fluids and crackers. Presentation, clinical findings, and plan discussed with Dr. Charlotta Newtonzan. Stable for discharge home.  Assessment and Plan   1. [redacted] weeks gestation of pregnancy   2. Nausea/vomiting in pregnancy   3. Ptyalism   4. Abdominal pain during pregnancy in first trimester   5.      Hypokalemia, mild  Discharge home Follow up in OB office next week as scheduled Rx Robinul Rx Pepcid Start Zofran (has Rx already)  Allergies as of 11/25/2016   No Known Allergies     Medication List    TAKE these medications   acetaminophen 500 MG tablet Commonly known as:  TYLENOL Take 500 mg by mouth every 4 (four) hours as needed. For pain   famotidine 20 MG tablet Commonly known as:  PEPCID Take 1 tablet (20 mg total) by mouth daily.   glycopyrrolate 1 MG tablet Commonly known as:  ROBINUL Take 1 tablet (1 mg total) by mouth 3 (three) times daily.      Donette LarryMelanie Avaline Stillson, CNM 11/25/2016, 5:48 PM

## 2016-11-26 LAB — GC/CHLAMYDIA PROBE AMP (~~LOC~~) NOT AT ARMC
Chlamydia: NEGATIVE
Neisseria Gonorrhea: NEGATIVE

## 2016-12-20 IMAGING — RF DG HYSTEROGRAM
8 series · 14 of 16 positions shown · IV contrast (omnipaque)
Comparison: None.

CLINICAL DATA: Infertility.

EXAM:
HYSTEROSALPINGOGRAM
TECHNIQUE: Following cleansing of the cervix and vagina with Betadine solution,
a hysterosalpingogram was performed using a 5-French
hysterosalpingogram catheter and Omnipaque 300 contrast. The patient
tolerated the examination without difficulty.

[Series 1: run · 2 of 2 slices shown (1 of 8)]
[im 1/2]
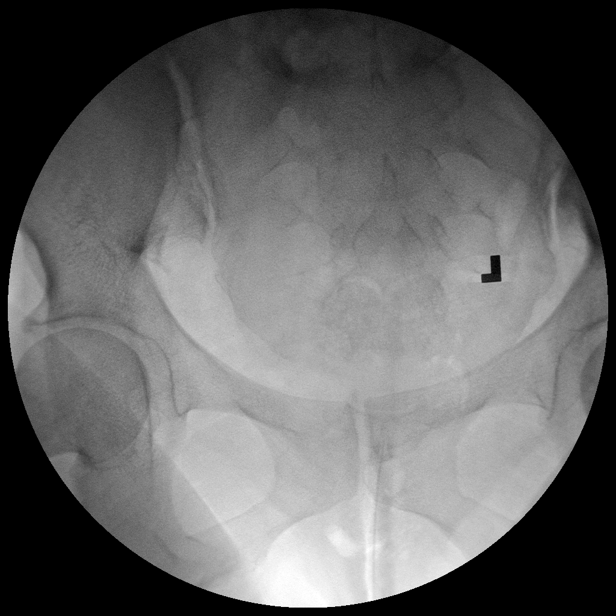
[im 2/2]
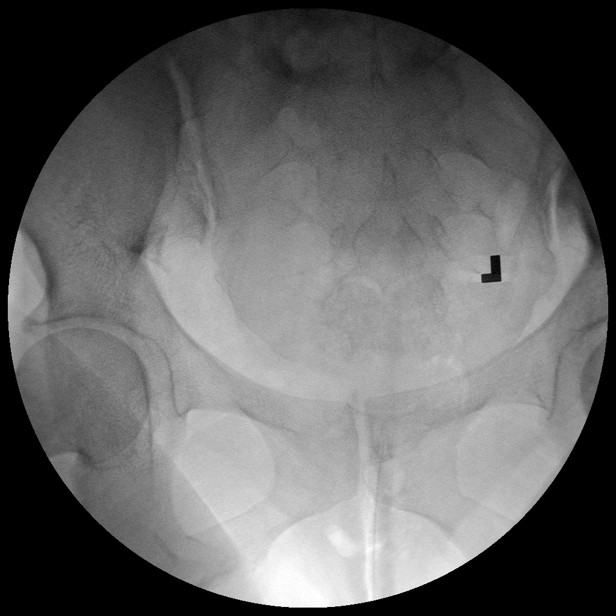

[Series 2: run · 1 of 2 slices shown (2 of 8)]
[im 1/2]
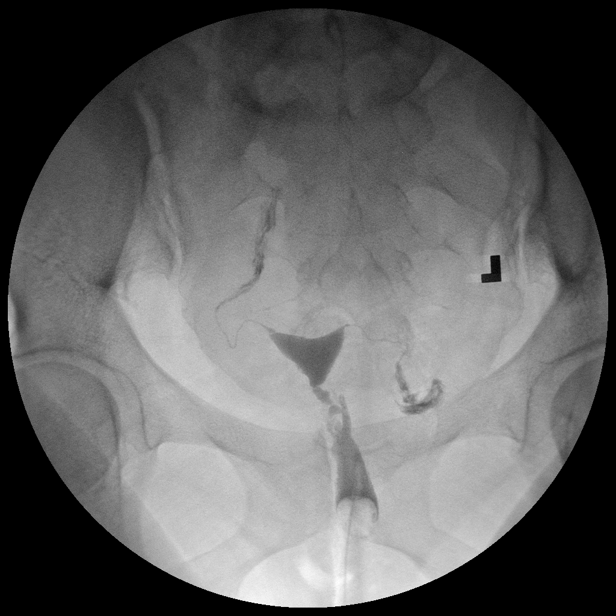

[Series 3: run · 2 of 2 slices shown (3 of 8)]
[im 1/2]
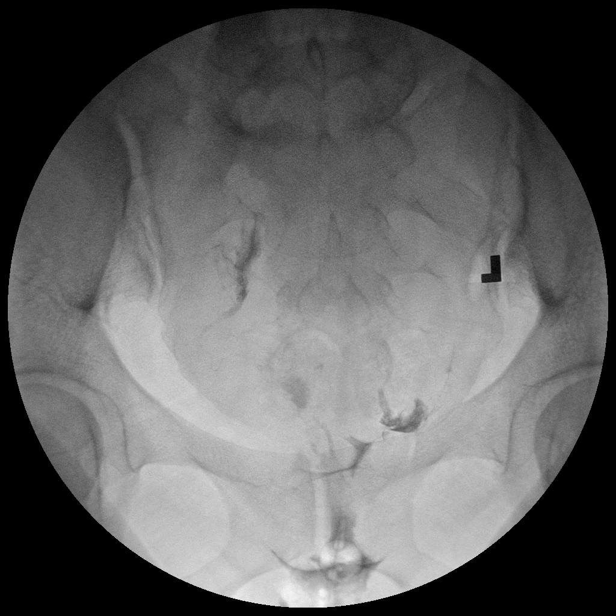
[im 2/2]
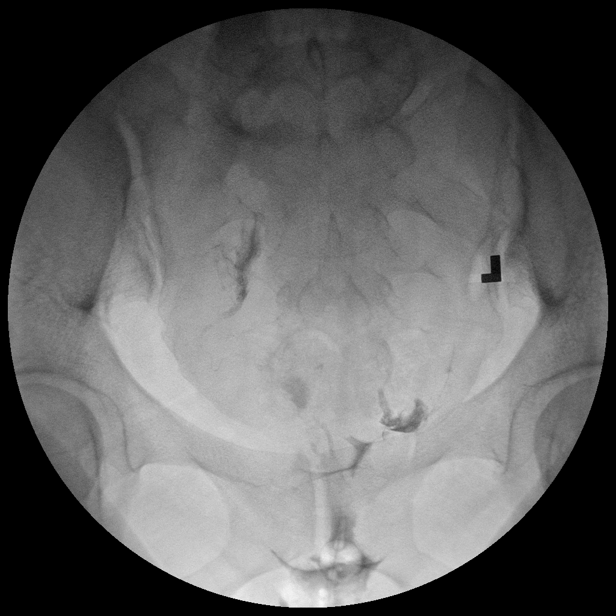

[Series 4: run · 2 of 2 slices shown (4 of 8)]
[im 1/2]
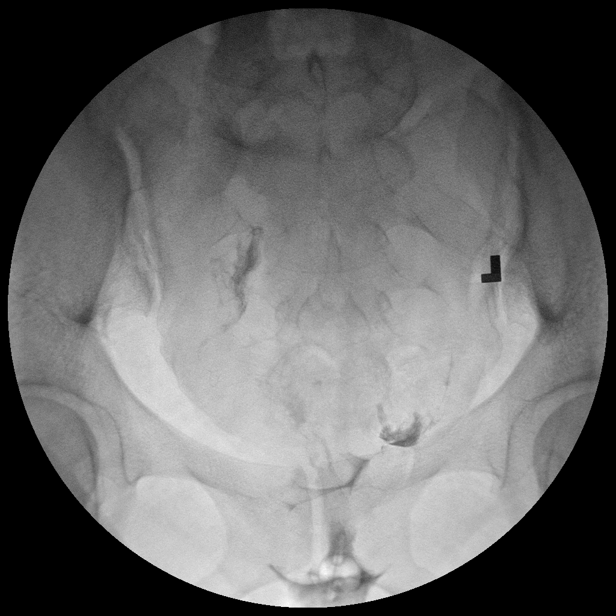
[im 2/2]
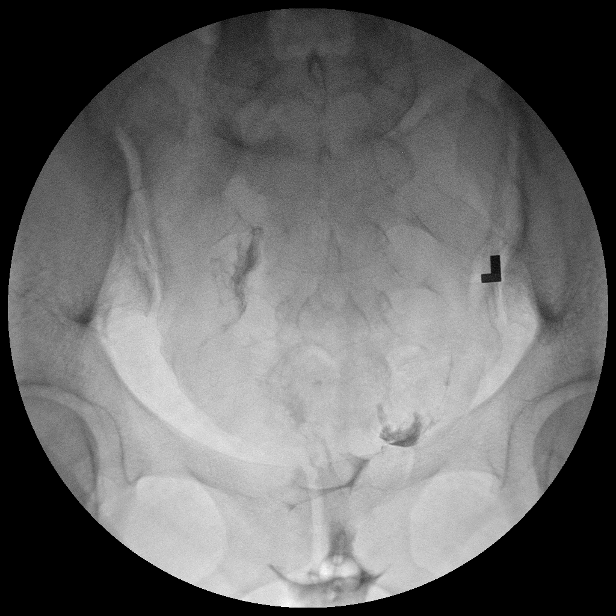

[Series 5: run · 2 of 2 slices shown (5 of 8)]
[im 1/2]
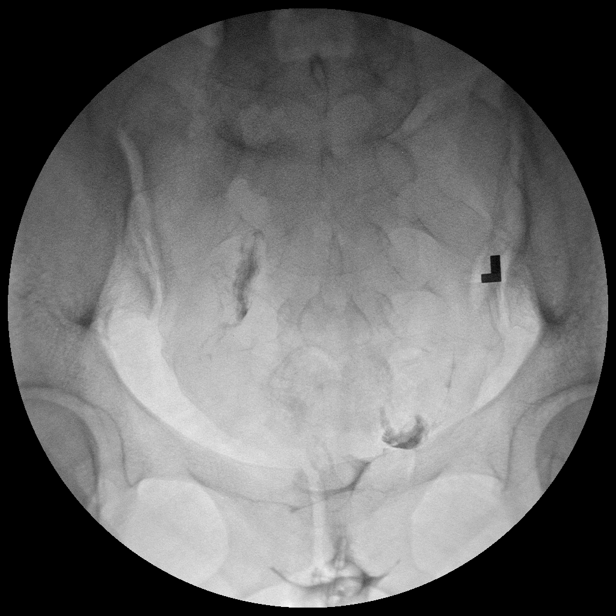
[im 2/2]
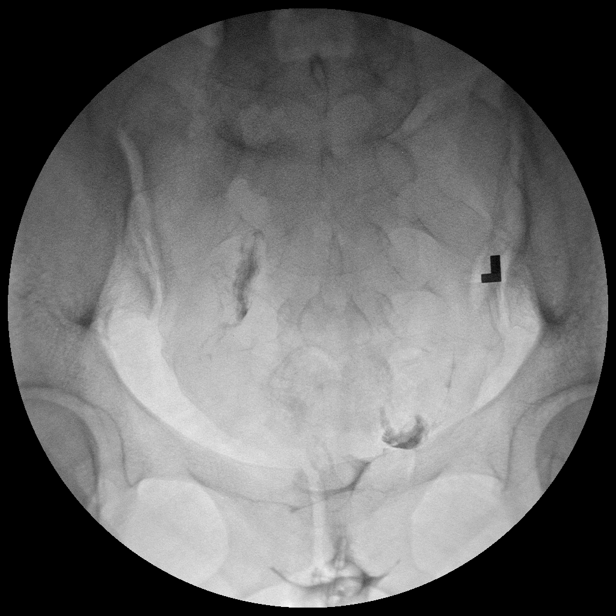

[Series 6: run · 1 of 2 slices shown (6 of 8)]
[im 1/2]
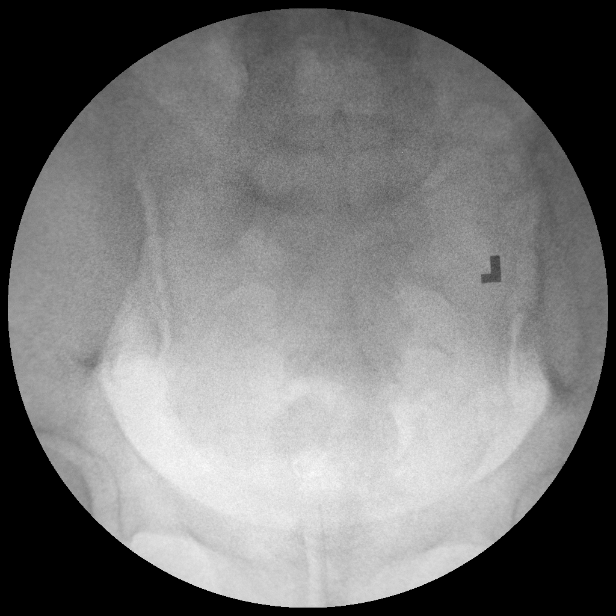

[Series 7: run · 2 of 2 slices shown (7 of 8)]
[im 1/2]
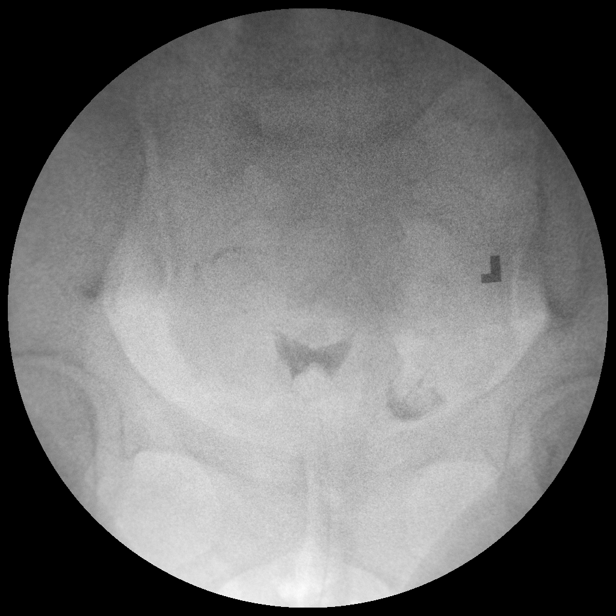
[im 2/2]
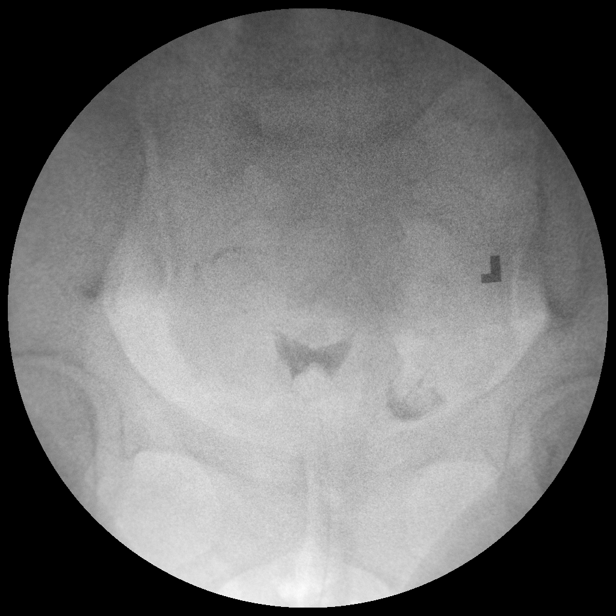

[Series 8: run · 2 of 2 slices shown (8 of 8)]
[im 1/2]
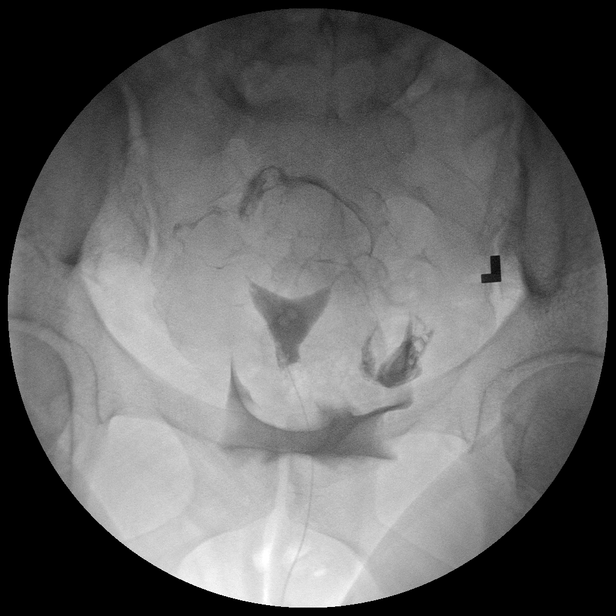
[im 2/2]
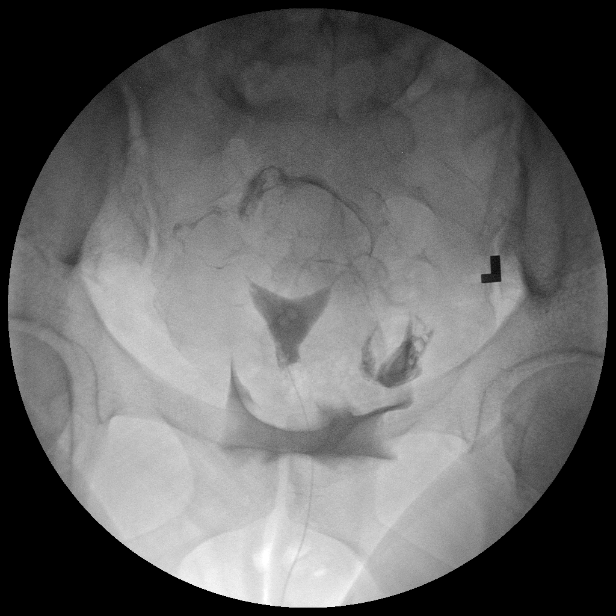

[14 of 16 positions shown; findings below may reference images not displayed]

FLUOROSCOPY TIME:  Radiation Exposure Index (as provided by the
fluoroscopic device):

If the device does not provide the exposure index:

Fluoroscopy Time:  1 minutes 30 seconds

Number of Acquired Images:  2
FINDINGS: The endometrial cavity is normal in appearance and contour. No signs
of mullerian duct anomaly.

Opacification of both fallopian tubes is seen. Both tubes appear
normal. Intraperitoneal spill of contrast from both fallopian tubes
is demonstrated.
IMPRESSION: Normal study. Both fallopian tubes are patent.

## 2016-12-21 ENCOUNTER — Other Ambulatory Visit (HOSPITAL_COMMUNITY): Payer: Self-pay | Admitting: Obstetrics & Gynecology

## 2016-12-21 DIAGNOSIS — Z3689 Encounter for other specified antenatal screening: Secondary | ICD-10-CM

## 2016-12-23 ENCOUNTER — Other Ambulatory Visit: Payer: Self-pay | Admitting: Obstetrics & Gynecology

## 2016-12-23 ENCOUNTER — Inpatient Hospital Stay (HOSPITAL_COMMUNITY): Payer: BLUE CROSS/BLUE SHIELD

## 2016-12-23 ENCOUNTER — Encounter (HOSPITAL_COMMUNITY): Payer: Self-pay | Admitting: *Deleted

## 2016-12-23 ENCOUNTER — Other Ambulatory Visit: Payer: Self-pay

## 2016-12-23 ENCOUNTER — Inpatient Hospital Stay (HOSPITAL_COMMUNITY)
Admission: AD | Admit: 2016-12-23 | Discharge: 2016-12-25 | DRG: 833 | Disposition: A | Discharge status: Home / Self Care | Payer: BLUE CROSS/BLUE SHIELD | Source: Ambulatory Visit | Attending: Obstetrics & Gynecology | Admitting: Obstetrics & Gynecology

## 2016-12-23 DIAGNOSIS — O99212 Obesity complicating pregnancy, second trimester: Secondary | ICD-10-CM | POA: Diagnosis present

## 2016-12-23 DIAGNOSIS — O210 Mild hyperemesis gravidarum: Secondary | ICD-10-CM

## 2016-12-23 DIAGNOSIS — K219 Gastro-esophageal reflux disease without esophagitis: Secondary | ICD-10-CM | POA: Diagnosis present

## 2016-12-23 DIAGNOSIS — O99612 Diseases of the digestive system complicating pregnancy, second trimester: Secondary | ICD-10-CM | POA: Diagnosis present

## 2016-12-23 DIAGNOSIS — O211 Hyperemesis gravidarum with metabolic disturbance: Principal | ICD-10-CM | POA: Diagnosis present

## 2016-12-23 DIAGNOSIS — O09529 Supervision of elderly multigravida, unspecified trimester: Secondary | ICD-10-CM

## 2016-12-23 DIAGNOSIS — O219 Vomiting of pregnancy, unspecified: Secondary | ICD-10-CM

## 2016-12-23 DIAGNOSIS — Z3492 Encounter for supervision of normal pregnancy, unspecified, second trimester: Secondary | ICD-10-CM

## 2016-12-23 DIAGNOSIS — O09522 Supervision of elderly multigravida, second trimester: Secondary | ICD-10-CM | POA: Diagnosis not present

## 2016-12-23 DIAGNOSIS — Z8759 Personal history of other complications of pregnancy, childbirth and the puerperium: Secondary | ICD-10-CM

## 2016-12-23 DIAGNOSIS — Z3A17 17 weeks gestation of pregnancy: Secondary | ICD-10-CM

## 2016-12-23 DIAGNOSIS — Z362 Encounter for other antenatal screening follow-up: Secondary | ICD-10-CM

## 2016-12-23 DIAGNOSIS — E66813 Obesity, class 3: Secondary | ICD-10-CM | POA: Diagnosis present

## 2016-12-23 LAB — CBC
HCT: 34.4 % — ABNORMAL LOW (ref 36.0–46.0)
HEMOGLOBIN: 11.4 g/dL — AB (ref 12.0–15.0)
MCH: 26.5 pg (ref 26.0–34.0)
MCHC: 33.1 g/dL (ref 30.0–36.0)
MCV: 79.8 fL (ref 78.0–100.0)
Platelets: 263 10*3/uL (ref 150–400)
RBC: 4.31 MIL/uL (ref 3.87–5.11)
RDW: 13 % (ref 11.5–15.5)
WBC: 7.2 10*3/uL (ref 4.0–10.5)

## 2016-12-23 LAB — URINALYSIS, ROUTINE W REFLEX MICROSCOPIC
Bacteria, UA: NONE SEEN
Bilirubin Urine: NEGATIVE
Glucose, UA: 150 mg/dL — AB
Hgb urine dipstick: NEGATIVE
KETONES UR: NEGATIVE mg/dL
Leukocytes, UA: NEGATIVE
Nitrite: NEGATIVE
PH: 6 (ref 5.0–8.0)
PROTEIN: NEGATIVE mg/dL
Specific Gravity, Urine: 1.011 (ref 1.005–1.030)

## 2016-12-23 LAB — BASIC METABOLIC PANEL
Anion gap: 8 (ref 5–15)
BUN: 11 mg/dL (ref 6–20)
CHLORIDE: 100 mmol/L — AB (ref 101–111)
CO2: 26 mmol/L (ref 22–32)
Calcium: 8.8 mg/dL — ABNORMAL LOW (ref 8.9–10.3)
Creatinine, Ser: 0.66 mg/dL (ref 0.44–1.00)
GFR calc non Af Amer: >60 mL/min (ref >60)
Glucose, Bld: 106 mg/dL — ABNORMAL HIGH (ref 65–99)
POTASSIUM: 3.2 mmol/L — AB (ref 3.5–5.1)
SODIUM: 134 mmol/L — AB (ref 135–145)

## 2016-12-23 MED ORDER — METHYLPREDNISOLONE 4 MG PO TABS: 4.0000 mg | ORAL_TABLET | Freq: Every day | ORAL | Status: DC

## 2016-12-23 MED ORDER — METHYLPREDNISOLONE 16 MG PO TABS
16.0000 mg | ORAL_TABLET | Freq: Every day | ORAL | Status: DC
  Administered 2016-12-24 – 2016-12-25 (×2): 16 mg via ORAL
  Filled 2016-12-23 (×3): qty 1

## 2016-12-23 MED ORDER — METHYLPREDNISOLONE 4 MG PO TABS: 8.0000 mg | ORAL_TABLET | Freq: Every day | ORAL | Status: DC

## 2016-12-23 MED ORDER — METHYLPREDNISOLONE 4 MG PO TABS
8.0000 mg | ORAL_TABLET | Freq: Every day | ORAL | Status: DC
  Filled 2016-12-23: qty 2

## 2016-12-23 MED ORDER — THIAMINE HCL 100 MG/ML IJ SOLN
Freq: Once | INTRAVENOUS | Status: AC
  Administered 2016-12-23: 19:00:00 via INTRAVENOUS
  Filled 2016-12-23: qty 1000

## 2016-12-23 MED ORDER — SODIUM CHLORIDE 0.9 % IV SOLN
12.5000 mg | Freq: Once | INTRAVENOUS | Status: AC
  Administered 2016-12-23: 12.5 mg via INTRAVENOUS
  Filled 2016-12-23: qty 0.5

## 2016-12-23 MED ORDER — GLYCOPYRROLATE 0.2 MG/ML IJ SOLN
0.1000 mg | INTRAMUSCULAR | Status: DC | PRN
  Filled 2016-12-23: qty 0.5

## 2016-12-23 MED ORDER — METHYLPREDNISOLONE 16 MG PO TABS
16.0000 mg | ORAL_TABLET | Freq: Every day | ORAL | Status: AC
  Administered 2016-12-24 – 2016-12-25 (×2): 16 mg via ORAL
  Filled 2016-12-23 (×2): qty 1

## 2016-12-23 MED ORDER — LACTATED RINGERS IV SOLN
INTRAVENOUS | Status: DC
  Administered 2016-12-24 – 2016-12-25 (×3): via INTRAVENOUS
  Administered 2016-12-25: 125 mL/h via INTRAVENOUS

## 2016-12-23 MED ORDER — FAMOTIDINE IN NACL 20-0.9 MG/50ML-% IV SOLN
20.0000 mg | Freq: Two times a day (BID) | INTRAVENOUS | Status: DC
  Administered 2016-12-23 – 2016-12-25 (×4): 20 mg via INTRAVENOUS
  Filled 2016-12-23 (×5): qty 50

## 2016-12-23 MED ORDER — METHYLPREDNISOLONE SODIUM SUCC 125 MG IJ SOLR
48.0000 mg | Freq: Once | INTRAMUSCULAR | Status: AC
  Administered 2016-12-23: 48 mg via INTRAVENOUS
  Filled 2016-12-23: qty 0.77

## 2016-12-23 MED ORDER — ONDANSETRON 8 MG PO TBDP
8.0000 mg | ORAL_TABLET | Freq: Four times a day (QID) | ORAL | Status: DC | PRN
  Administered 2016-12-24: 8 mg via ORAL
  Filled 2016-12-23 (×2): qty 1

## 2016-12-23 MED ORDER — METHYLPREDNISOLONE 16 MG PO TABS
16.0000 mg | ORAL_TABLET | Freq: Every day | ORAL | Status: DC
  Administered 2016-12-24: 16 mg via ORAL
  Filled 2016-12-23 (×2): qty 1

## 2016-12-23 NOTE — MAU Note (Signed)
Pt sent from office for hyperemesis. Not able to keep food down. Loosing weight.

## 2016-12-23 NOTE — MAU Note (Signed)
Pt was to be direct admit.  Transferred up to rm [phone] via wc

## 2016-12-23 NOTE — Progress Notes (Signed)
Unable to doppler baby, patient will be sent to US to obtain heart beat.

## 2016-12-23 NOTE — Progress Notes (Unsigned)
HPI: 41 y/o G3P1011 @ 5474w3d estimated gestational age (as dated by LMP c/w 7wk week ultrasound) admitted for hyperemesis.  This has been an ongoing issue throughout her pregnancy.  She has been tried on several medications including Zofran, Reglan, Diclegis/Bonjesta.  She has also been using acid reflux medication as well including zantac and pepcid with no improvement.  She was last seen in the ER for this problem on *** She was hesitant about hospital admission, but the plan was that in the next week if she continued to lose weight, the next step would be admission for steroid course.  The patient presented today with an additional 2lb weight loss in 1 week.  Starting weight: 314, today's weight 287lb- she has lost a total of 27lbs.  She reports that some days are better than others. She is still taking zofran 8mg  ODT 3x daily, but notes vomiting at least 1-2x per day.  She has significant decreased appetite and does her best to po hydrate.  She had a similar problem in her first pregnancy and states that nothing worked and basically just stayed in bed.  Pregnancy complicated by: 1) Hyperemesis: as outlined above 2) AMA: Declined panorama testing 3) Obesity: starting BMI: 49 4) h/o preeclampsia- not yet started ASA daily as she cannot tolerate pills  PNL:  GBS unknown, Rub Immune, Hep B neg, RPR NR, HIV neg, GC/C neg, glucola:not yet performed Hgb: 12.1 Blood type: B positive  OBHx:  -SAB in first trimester -NSVD @ 37wk- 4#, NSVD complicated by preeclampsia  PMHx:  Morbid obesity Meds:  PNV, zofran,  Allergy:  No Known Allergies SurgHx: none SocHx:   no Tobacco, no  EtOH, no Illicit Drugs  O: to be obtained In office today: BP 130/86  Gen. Appears well, alert & oriented Neck: Within normal limits CV.  RRR Resp. CTAB, no wheeze or crackles. Abd. Gravid,  no tenderness,  no rigidity,  no guarding Extr.  no edema B/L , no calf tenderness, neg Homan's B/L Psych: mood and affect  appropriate FHT: Difficulty obtaining by doppler, ~ 120, previously FHT on 11/16 by US  Labs: see orders  A/P:  41 y.o. G3P1011 @ 3574w3d EGA who presents for hyperemesis -FWB:  Plan for doppler daily, if unable to obtain today, plan for US today -Hyperemesis:  -plan for steroid taper per protocol  -continue with zofrant ODT -advance diet as tolerated, for now clears  -IV fluids with banana bag daily  -IV pepcid twice daily, transition to either oral pepcid or zantac for home -Maternal well being:  -prenatal vitamin as IV, transition to po when tolerable  -if tolerating po, add ASA 81mg  daily  -tylenol prn  -activity as tolerated  Myna HidalgoJennifer Jaida Basurto, DO [phone] (pager) [phone] (office)

## 2016-12-23 NOTE — H&P (Signed)
HPI: 41 y/o G3P1011 @ 7037w3d estimated gestational age (as dated by LMP c/w 7wk week ultrasound) admitted for hyperemesis.  This has been an ongoing issue throughout her pregnancy.  She has been tried on several medications including Zofran, Reglan, Diclegis/Bonjesta.  She has also been using acid reflux medication as well including zantac and pepcid with no improvement.  She was last seen in the ER for this problem on 10/24 where she was given Robinul for Ptyalism and pepcid, which she states did not help. She was hesitant about hospital admission, but the plan was that in the next week if she continued to lose weight, the next step would be admission for steroid course.  The patient presented today with an additional 2lb weight loss in 1 week.  Starting weight: 314, today's weight 287lb- she has lost a total of 27lbs.  She reports that some days are better than others. She is still taking zofran 8mg  ODT 3x daily, but notes vomiting at least 1-2x per day.  She has significant decreased appetite and does her best to po hydrate.  She had a similar problem in her first pregnancy and states that nothing worked and basically just stayed in bed.  Pregnancy complicated by: 1) Hyperemesis: as outlined above 2) AMA: Declined panorama testing 3) Obesity: starting BMI: 49 4) h/o preeclampsia- not yet started ASA daily as she cannot tolerate pills  PNL:  GBS unknown, Rub Immune, Hep B neg, RPR NR, HIV neg, GC/C neg, glucola:not yet performed Hgb: 12.1 Blood type: B positive  OBHx:  -SAB in first trimester -NSVD @ 37wk- 4#, NSVD complicated by preeclampsia  PMHx:  Morbid obesity Meds:  PNV, zofran,  Allergy:  No Known Allergies SurgHx: none SocHx:   no Tobacco, no  EtOH, no Illicit Drugs  O: BP 128/67 (BP Location: Left Arm)   Pulse (!) 109   Temp 98.5 F (36.9 C) (Oral)   Resp 16   Ht 5\' 6"  (1.676 m)   Wt 132 kg (291 lb)   BMI 46.97 kg/m   In office today: BP 130/86  Gen. Appears well, alert  & oriented Neck: Within normal limits CV.  RRR, slight tachycardia appreciated Resp. CTAB, no wheeze or crackles. Abd. Gravid,  no tenderness,  no rigidity,  no guarding Extr.  no edema B/L , no calf tenderness, neg Homan's B/L Psych: mood and affect appropriate FHT: Difficulty obtaining by doppler, ~ 120, previously FHT on 11/16 by US  Labs: see orders  A/P:  41 y.o. G3P1011 @ 1837w3d EGA who presents for hyperemesis -FWB:  Plan for doppler daily, if unable to obtain, plan for US today -Hyperemesis:  -plan for steroid taper per protocol  -continue with zofrant ODT   -advance diet as tolerated, for now clears  -IV fluids with banana bag daily  -IV pepcid twice daily, transition to either oral pepcid or zantac for home -Maternal well being:  -prenatal vitamin as IV, transition to po when tolerable  -if tolerating po, add ASA 81mg  daily  -tylenol prn  -activity as tolerated  -SCDs while in bed for DVT prophylaxis  Myna HidalgoJennifer Julane Crock, DO [phone] (pager) [phone] (office)

## 2016-12-24 LAB — GLUCOSE, CAPILLARY: Glucose-Capillary: 147 mg/dL — ABNORMAL HIGH (ref 65–99)

## 2016-12-24 LAB — GLUCOSE, RANDOM: GLUCOSE: 173 mg/dL — AB (ref 65–99)

## 2016-12-24 MED ORDER — ONDANSETRON 8 MG PO TBDP
8.0000 mg | ORAL_TABLET | Freq: Three times a day (TID) | ORAL | Status: DC
  Administered 2016-12-24 – 2016-12-25 (×3): 8 mg via ORAL
  Filled 2016-12-24 (×6): qty 1

## 2016-12-24 MED ORDER — BOOST PLUS PO LIQD
237.0000 mL | Freq: Three times a day (TID) | ORAL | Status: DC
  Administered 2016-12-25: 237 mL via ORAL
  Filled 2016-12-24 (×2): qty 237

## 2016-12-24 MED ORDER — ENSURE ENLIVE PO LIQD
237.0000 mL | Freq: Two times a day (BID) | ORAL | Status: DC
  Administered 2016-12-24: 237 mL via ORAL
  Filled 2016-12-24 (×3): qty 237

## 2016-12-24 MED ORDER — POTASSIUM CHLORIDE 10 MEQ/100ML IV SOLN
10.0000 meq | INTRAVENOUS | Status: AC
  Administered 2016-12-24 (×3): 10 meq via INTRAVENOUS
  Filled 2016-12-24 (×3): qty 100

## 2016-12-24 MED ORDER — PROMETHAZINE HCL 25 MG/ML IJ SOLN
12.5000 mg | INTRAMUSCULAR | Status: DC
  Administered 2016-12-24 – 2016-12-25 (×4): 12.5 mg via INTRAVENOUS
  Filled 2016-12-24 (×4): qty 1

## 2016-12-24 NOTE — Progress Notes (Signed)
Nutrition  Long discussion with pt about food options. Pts options are very limited as she has many intolerances, sour things, sweet things, strong smelling  foods.   My suggestions are : Ensure or Boost vanilla, 1/2 and 1/2 with milk to make less sweet Try bland food options, even if meal is not balanced : grits, oatmeal, yogurt, potatoes, rice, soups, canned fruit Munch on bland crunchy foods all day, dry cereal, pretzels, bland cookies, crackers Constantly munch or snack to never let stomach get empty  Discussed tubefeedings and parenteral support, so that the pt would be aware of the options if situation becomes critical  Food options that this pt can tolerate  are very limited - if there was a pleasant aroma she could tolerate in the room this might mask the food smell. She is unable to handle any candy sweet or sour, no gum, to mask the food flavor  Elisabeth CaraKatherine Dayton Sherr M.Odis LusterEd. R.D. LDN Neonatal Nutrition Support Specialist/RD III Pager [phone]      Phone [phone]

## 2016-12-24 NOTE — Progress Notes (Addendum)
Hospital day # 1 pregnancy at 5357w6d--Hyperemesis  S:  Slept well, moderate nausea this am, no vomiting, still spitting.  Denies pain.       Still has reflux.  Wants to try to eat this am.       Reports struggled with N/V through entire last pregnancy, resolved with              Delivery.       On day 2 of steroid taper, also on Robinul, Famotidine--all IV meds.        O: BP 140/67 (BP Location: Right Arm)   Pulse 92   Temp 97.7 F (36.5 C) (Oral)   Resp 18   Ht 5\' 6"  (1.676 m)   Wt 132 kg (291 lb)   SpO2 100%   BMI 46.97 kg/m    Vitals:   12/23/16 2005 12/24/16 0002 12/24/16 0335 12/24/16 0750  BP: 107/64 139/76 105/64 140/67  Pulse: 94 91 86 92  Resp: 17 18 20 18   Temp: 98.7 F (37.1 C) 98 F (36.7 C) 98.4 F (36.9 C) 97.7 F (36.5 C)  TempSrc: Oral Oral Oral Oral  SpO2: 100% 100% 100% 100%  Weight:      Height:            FHR:  157 by US yesterday evening      Uterus non-tender      Extremities: no significant edema and no signs of DVT, SCDs while in bed          Labs:  Results for orders placed or performed during the hospital encounter of 12/23/16 (from the past 24 hour(s))  Urinalysis, Routine w reflex microscopic     Status: Abnormal   Collection Time: 12/23/16  5:15 PM  Result Value Ref Range   Color, Urine STRAW (A) YELLOW   APPearance CLEAR CLEAR   Specific Gravity, Urine 1.011 1.005 - 1.030   pH 6.0 5.0 - 8.0   Glucose, UA 150 (A) NEGATIVE mg/dL   Hgb urine dipstick NEGATIVE NEGATIVE   Bilirubin Urine NEGATIVE NEGATIVE   Ketones, ur NEGATIVE NEGATIVE mg/dL   Protein, ur NEGATIVE NEGATIVE mg/dL   Nitrite NEGATIVE NEGATIVE   Leukocytes, UA NEGATIVE NEGATIVE   RBC / HPF 0-5 0 - 5 RBC/hpf   WBC, UA 0-5 0 - 5 WBC/hpf   Bacteria, UA NONE SEEN NONE SEEN   Squamous Epithelial / LPF 0-5 (A) NONE SEEN   Mucus PRESENT   CBC     Status: Abnormal   Collection Time: 12/23/16  5:43 PM  Result Value Ref Range   WBC 7.2 4.0 - 10.5 K/uL   RBC 4.31 3.87 - 5.11  MIL/uL   Hemoglobin 11.4 (L) 12.0 - 15.0 g/dL   HCT 08.634.4 (L) 57.836.0 - 46.946.0 %   MCV 79.8 78.0 - 100.0 fL   MCH 26.5 26.0 - 34.0 pg   MCHC 33.1 30.0 - 36.0 g/dL   RDW 62.913.0 52.811.5 - 41.315.5 %   Platelets 263 150 - 400 K/uL  Basic metabolic panel     Status: Abnormal   Collection Time: 12/23/16  5:43 PM  Result Value Ref Range   Sodium 134 (L) 135 - 145 mmol/L   Potassium 3.2 (L) 3.5 - 5.1 mmol/L   Chloride 100 (L) 101 - 111 mmol/L   CO2 26 22 - 32 mmol/L   Glucose, Bld 106 (H) 65 - 99 mg/dL   BUN 11 6 - 20 mg/dL   Creatinine, Ser 2.440.66  0.44 - 1.00 mg/dL   Calcium 8.8 (L) 8.9 - 10.3 mg/dL   GFR calc non Af Amer >60 >60 mL/min   GFR calc Af Amer >60 >60 mL/min   Anion gap 8 5 - 15  Glucose, fasting - daily while on taper     Status: Abnormal   Collection Time: 12/24/16  5:23 AM  Result Value Ref Range   Glucose, Bld 173 (H) 65 - 99 mg/dL  Glucose, capillary     Status: Abnormal   Collection Time: 12/24/16  6:02 AM  Result Value Ref Range   Glucose-Capillary 147 (H) 65 - 99 mg/dL         Meds:  . methylPREDNISolone  16 mg Oral Q breakfast   Followed by  . [START ON 12/28/2016] methylPREDNISolone  8 mg Oral Q breakfast   Followed by  . [START ON 01/04/2017] methylPREDNISolone  4 mg Oral Q breakfast  . methylPREDNISolone  16 mg Oral Q1400   Followed by  . [START ON 12/26/2016] methylPREDNISolone  8 mg Oral Q1400   Followed by  . [START ON 12/29/2016] methylPREDNISolone  4 mg Oral Q1400  . methylPREDNISolone  16 mg Oral QHS   Followed by  . [START ON 12/27/2016] methylPREDNISolone  8 mg Oral QHS   Followed by  . [START ON 12/30/2016] methylPREDNISolone  4 mg Oral QHS    A: 8630w6d with hyperemesis      Stable      Mild hypokalemia  P: Continue current plan of care      Upcoming tests/treatments:  Offer advancement of diet, observe tolerance.      Continue IV meds until tolerance of po intake demonstrated.      Will consult regarding hypokalemia.      Daily weight      MDs will  follow  Nigel BridgemanVicki Markasia Carrol CNM, [place] 12/24/2016 8:15 AM

## 2016-12-24 NOTE — Progress Notes (Addendum)
In to see patient for recheck of status.  Weight today 293 (132 Kg)--admission weight 296 (134)  Ate small amount this am, tolerated without vomiting, but still had reflux and nausea. Unable to eat anything other than single pack of peanut butter since.   Very intolerant of any significant smell to food.  Frustrated regarding hunger, but no ability to determine what she will tolerate.  "Everything that has a smell turns my stomach".  Questions whether kitchen can cook food without seasoning and whether food can be brought to her room uncovered, to avoid heavy smell wafting up from plate when uncovered.  On Pepcid IV daily On Robinul 0.1 mg IV q 4 hours prn--has not taken. On Zofran 8 mg ODT prn--last dose 1225 No prn Phenergan ordered Took 8a and 2pm Medrol doses in applesauce over several attempts, "but takes very long".  Dietician note in chart, but she did not discuss any of the above issues with the patient.  I paged her to discuss the patient, but no answer. Patient plans to call kitchen to discuss options today. I placed order for dietician consult for tomorrow, for discussion directly with patient regarding her issues/requests.  Plan: Change Zofran ODT to q 8 hours, not prn. Add Phenergan 12.5 mg IV q 4 hours, not prn. Patient will continue to search for foods she can tolerate. Support to patient for issues and concerns. Plan SW consult tomorrow. Plan dietician consult for direct conversation with patient regarding issues. Per consult with Dr. Sallye OberKulwa, 3 runs of K+ 10 meq. Recheck BMP in am.  Nigel BridgemanVicki Jac Romulus, CNM 12/24/16 4:17p  Nigel BridgemanVicki Sharece Fleischhacker, CNM 12/24/16 4p

## 2016-12-24 NOTE — Progress Notes (Signed)
Initial Nutrition Assessment  DOCUMENTATION CODES:   Morbid obesity  INTERVENTION:  Diet advanced as tol Snacks TID -  Small freq meals Ensure Enlive  BID  NUTRITION DIAGNOSIS:   Inadequate oral intake related to vomiting, nausea as evidenced by   GOAL:  Tol of po diet  MONITOR:  Weight trends  REASON FOR ASSESSMENT:  Other (Comment)(Hyperemesis)   ASSESSMENT:  17 6/7 weeks, hyperemesis, continues to lose weight and adm for steroid taper. Hyperemesis with prev preg, entire preg. Pre preg weight 314 lbs, BMI 51.2. Currently with a 6.7 % loss of usual weight   Diet Order:  DIET SOFT Room service appropriate? Yes; Fluid consistency: Thin  EDUCATION NEEDS:   No education needs have been identified at this time  Skin:  Skin Assessment: Reviewed RN Assessment   Height:   Ht Readings from Last 1 Encounters:  12/23/16 5\' 6"  (1.676 m)    Weight:   Wt Readings from Last 1 Encounters:  12/24/16 293 lb 0.1 oz (132.9 kg)    Ideal Body Weight:   130 lbs  BMI:  Body mass index is 47.29 kg/m.  Estimated Nutritional Needs:   Kcal:  2400   Protein:  100-110  Fluid:  2.5 L    Elisabeth CaraKatherine Geordan Xu M.Odis LusterEd. R.D. LDN Neonatal Nutrition Support Specialist/RD III Pager [phone]      Phone [phone]

## 2016-12-25 DIAGNOSIS — K219 Gastro-esophageal reflux disease without esophagitis: Secondary | ICD-10-CM

## 2016-12-25 DIAGNOSIS — O09529 Supervision of elderly multigravida, unspecified trimester: Secondary | ICD-10-CM

## 2016-12-25 DIAGNOSIS — E66813 Obesity, class 3: Secondary | ICD-10-CM | POA: Diagnosis present

## 2016-12-25 DIAGNOSIS — Z8759 Personal history of other complications of pregnancy, childbirth and the puerperium: Secondary | ICD-10-CM

## 2016-12-25 LAB — BASIC METABOLIC PANEL
Anion gap: 5 (ref 5–15)
BUN: 6 mg/dL (ref 6–20)
CALCIUM: 8.8 mg/dL — AB (ref 8.9–10.3)
CHLORIDE: 106 mmol/L (ref 101–111)
CO2: 25 mmol/L (ref 22–32)
CREATININE: 0.6 mg/dL (ref 0.44–1.00)
Glucose, Bld: 128 mg/dL — ABNORMAL HIGH (ref 65–99)
Potassium: 3.9 mmol/L (ref 3.5–5.1)
SODIUM: 136 mmol/L (ref 135–145)

## 2016-12-25 LAB — GLUCOSE, CAPILLARY: GLUCOSE-CAPILLARY: 121 mg/dL — AB (ref 65–99)

## 2016-12-25 MED ORDER — PROMETHAZINE HCL 25 MG/ML IJ SOLN: 12.5000 mg | Freq: Four times a day (QID) | INTRAMUSCULAR | Status: DC | PRN

## 2016-12-25 MED ORDER — ONDANSETRON 8 MG PO TBDP: 8.0000 mg | ORAL_TABLET | Freq: Three times a day (TID) | ORAL | 4 refills | Status: DC

## 2016-12-25 MED ORDER — PROMETHAZINE HCL 25 MG/ML IJ SOLN: 12.5000 mg | INTRAMUSCULAR | Status: DC | PRN

## 2016-12-25 MED ORDER — METHYLPREDNISOLONE 4 MG PO TABS: ORAL_TABLET | ORAL | 0 refills | Status: DC

## 2016-12-25 MED ORDER — BOOST PLUS PO LIQD: 237.0000 mL | Freq: Three times a day (TID) | ORAL | Status: DC

## 2016-12-25 NOTE — Progress Notes (Signed)
Patient refused Ensure, but agreed to try the Boost.  Patient took sip of Boost and said " this taste awful, I can't drink this."  Patient refused Boost and was looking at menu to order breakfast.

## 2016-12-25 NOTE — Progress Notes (Signed)
Discharge instructions reviewed with patient.  Patient states understanding of home care, medications, activity, signs/symptoms to report to MD and return MD office visit.  Patients significant other and family will assist with her care @ home.  No home  equipment needed, patient has prescriptions and all personal belongings.  Discussed Medrol taper and gave patient copy of her schedule and pt. Stated understanding.  Patient ambulated for discharge in stable condition with staff without incident.

## 2016-12-25 NOTE — Progress Notes (Signed)
.  Pharmacy Consult:   MEDROL (METHYLPREDNISOLONE) TAPER  FOR HYPEREMESIS GRAVIDARUM PATIENTS  The following is a 14 day taper of methylprednisolone for hyperemesis. Doses on day 1  will be given IV.  All doses starting on day 2  will be given PO. (If patient cannot tolerate oral medications, contact the pharmacy to change route to IV.)   Date Day Morning Midday Bedtime  11/21 1 -  48 mg  11/22 2 16  mg 16 mg 16 mg  11/23 3 16  mg 16 mg 16 mg  11/24 4 16  mg 8 mg 16 mg  11/25 5 16  mg 8 mg 8 mg  11/26 6 8  mg 8 mg 8 mg  11/27 7 8  mg 4 mg 8 mg  11/28 8 8  mg 4 mg 4 mg  11/29 9 8  mg 4 mg   11/30 10 8  mg 4 mg   12/1 11 8  mg    12/2 12 8  mg    12/3 13 4  mg    12/4 14 4  mg     Check fasting blood sugars daily while on the taper. Notify MD if fasting blood sugar>95.  Benetta SparVictoria Jodi Kappes 12/25/2016

## 2016-12-25 NOTE — Progress Notes (Addendum)
Hospital day # 2 pregnancy at 275w0d--Hyperemesis.  S:  Sleeping soundly.       RN reports patient ate grilled chicken breast and some rice for dinner.  Noted        significant sedation from Phenergan.        Nutritionist saw patient last evening, with recommendations for dietary choices, nutritionist supplement.   O: BP 116/60 (BP Location: Right Wrist)   Pulse 83   Temp 98.2 F (36.8 C) (Oral)   Resp 17   Ht 5\' 6"  (1.676 m)   Wt 132.9 kg (293 lb 0.1 oz)   SpO2 100%   BMI 47.29 kg/m    Vitals:   12/24/16 1230 12/24/16 1545 12/24/16 1954 12/24/16 2313  BP:  140/71 124/61 116/60  Pulse:  91 90 83  Resp:  18 16 17   Temp:  98.6 F (37 C) 98.7 F (37.1 C) 98.2 F (36.8 C)  TempSrc:  Oral Oral Oral  SpO2:  99% 100% 100%  Weight: 132.9 kg (293 lb 0.1 oz)     Height:            FHR 153      Contractions:   None      Uterus non-tender      Extremities: no significant edema and no signs of DVT          Labs:  BMP pending       Meds:        Scheduled--Methyprednisolone taper, Zofran, Pepcid, Ensure Enlive, Boost    Received Phenergan q4 hour during night.  PRN--Robinul       IV at 125 cc/her, with MVI bag daily.  A: 575w0d with hyperemesis      Food aversions   P: Continue current plan of care      Upcoming tests/treatments:  Continue Zofran ATC, make Phenergan prn, daily weights.      Will see after patient awakens.      MDs will follow  Nigel BridgemanVicki Nolia Tschantz CNM, [place] 12/25/2016 7:23 AM

## 2016-12-25 NOTE — Progress Notes (Signed)
For breakfast pt. Has all her eggs, all her bacon, 1/2 her pancakes and 1/2 a banana.  No nausea or vomiting at this time.

## 2016-12-25 NOTE — Discharge Instructions (Signed)

## 2016-12-25 NOTE — Progress Notes (Signed)
Nutrition  Pharmacy unable to obtain vanilla Boost, only chocolate is available in the cone system. Pt reports unable to tol chocolate products. Pt will need to continue to try vanilla Ensure [facility]  Chau Savell M.Odis LusterEd. R.D. LDN Neonatal Nutrition Support Specialist/RD III Pager [phone]      Phone [phone]

## 2016-12-25 NOTE — Plan of Care (Signed)
Patient is taking in very little food or nutritional supplements. Minimal nausea this shift, but hardly any po intake. IV fluids continue as prescribed. Patient encouraged to make choices about what foods and beverages she likes and can tolerate. Nutritionist in to talk with her.

## 2016-12-25 NOTE — Progress Notes (Signed)
CSW acknowledges consult.  CSW attempted to meet with patient, however patient was discharged.    Blaine HamperAngel Boyd-Gilyard, MSW, LCSW Clinical Social Work [phone]

## 2016-12-25 NOTE — Discharge Summary (Signed)
Physician Discharge Summary  Patient ID: Kimberly Joseph MRN: [id number] DOB/AGE: 05-16-1975 41 y.o.  Admit date: 12/23/2016 Discharge date: 12/25/2016  Admission Diagnoses:  IUP at 17 3/7 weeks, hyperemesis, AMA, obesity, history of pre-eclampsia, reflux  Discharge Diagnoses:  Active Problems:   Hyperemesis affecting pregnancy, antepartum   AMA (advanced maternal age) multigravida 35+   Obesity, Class III, BMI 40-49.9 (morbid obesity) ([place])   History of pre-eclampsia   Acid reflux   Discharged Condition: stable  Hospital Course: Admitted on 12/23/16 by Dr. Nelda Marseille due to exacerbation of hyperemesis.  Had tried Zofran, Reglan, Diclegis/Bonjesta, Pepcid, Robinul without benefit.  Had had on-going weight loss, from starting weight of 314--was 387 on day of admission.  IV hydration was initiated with MVI bag daily, with steroid taper begun. Zofran ODT was continued, with Pepcid IV ordered.  Nutritional consult was made, and patient was seen at the bedside for discussion of food aversions and recommendations. She was given 3 runs of K+ due to mild hypokalemia, with resolution.  Zofran and Phenergan were given around the clock on day 1 of hospitalization, with improvement by 12/25/16.  Patient was able to eat a chicken breast and rice in the evening of 12/24/16, and was able to eat bacon and eggs on the morning of 11/23 with tolerance.  At the time of d/c, she was taking the methylprednisolone tablets in applesauce, and using the Zofran ODT.  She still preferred liquid forms of medications, but was generally tolerating po intake better.  She was desiring to go home, so she was d/c'd home today.  She is to f/u as scheduled at [facility] in the upcoming week.  She also has f/u US scheduled at [facility] on 01/05/17 for anatomy.  Consults: None  Significant Diagnostic Studies:  Results for orders placed or performed during the hospital encounter of 12/23/16 (from the past 48 hour(s))  Urinalysis, Routine w  reflex microscopic     Status: Abnormal   Collection Time: 12/23/16  5:15 PM  Result Value Ref Range   Color, Urine STRAW (A) YELLOW   APPearance CLEAR CLEAR   Specific Gravity, Urine 1.011 1.005 - 1.030   pH 6.0 5.0 - 8.0   Glucose, UA 150 (A) NEGATIVE mg/dL   Hgb urine dipstick NEGATIVE NEGATIVE   Bilirubin Urine NEGATIVE NEGATIVE   Ketones, ur NEGATIVE NEGATIVE mg/dL   Protein, ur NEGATIVE NEGATIVE mg/dL   Nitrite NEGATIVE NEGATIVE   Leukocytes, UA NEGATIVE NEGATIVE   RBC / HPF 0-5 0 - 5 RBC/hpf   WBC, UA 0-5 0 - 5 WBC/hpf   Bacteria, UA NONE SEEN NONE SEEN   Squamous Epithelial / LPF 0-5 (A) NONE SEEN   Mucus PRESENT   CBC     Status: Abnormal   Collection Time: 12/23/16  5:43 PM  Result Value Ref Range   WBC 7.2 4.0 - 10.5 K/uL   RBC 4.31 3.87 - 5.11 MIL/uL   Hemoglobin 11.4 (L) 12.0 - 15.0 g/dL   HCT 34.4 (L) 36.0 - 46.0 %   MCV 79.8 78.0 - 100.0 fL   MCH 26.5 26.0 - 34.0 pg   MCHC 33.1 30.0 - 36.0 g/dL   RDW 13.0 11.5 - 15.5 %   Platelets 263 150 - 400 K/uL  Basic metabolic panel     Status: Abnormal   Collection Time: 12/23/16  5:43 PM  Result Value Ref Range   Sodium 134 (L) 135 - 145 mmol/L   Potassium 3.2 (L) 3.5 - 5.1 mmol/L  Chloride 100 (L) 101 - 111 mmol/L   CO2 26 22 - 32 mmol/L   Glucose, Bld 106 (H) 65 - 99 mg/dL   BUN 11 6 - 20 mg/dL   Creatinine, Ser 0.66 0.44 - 1.00 mg/dL   Calcium 8.8 (L) 8.9 - 10.3 mg/dL   GFR calc non Af Amer >60 >60 mL/min   GFR calc Af Amer >60 >60 mL/min    Comment: (NOTE) The eGFR has been calculated using the CKD EPI equation. This calculation has not been validated in all clinical situations. eGFR's persistently <60 mL/min signify possible Chronic Kidney Disease.    Anion gap 8 5 - 15  Glucose, fasting - daily while on taper     Status: Abnormal   Collection Time: 12/24/16  5:23 AM  Result Value Ref Range   Glucose, Bld 173 (H) 65 - 99 mg/dL  Glucose, capillary     Status: Abnormal   Collection Time: 12/24/16   6:02 AM  Result Value Ref Range   Glucose-Capillary 147 (H) 65 - 99 mg/dL  Basic metabolic panel     Status: Abnormal   Collection Time: 12/25/16  5:56 AM  Result Value Ref Range   Sodium 136 135 - 145 mmol/L   Potassium 3.9 3.5 - 5.1 mmol/L    Comment: REPEATED TO VERIFY DELTA CHECK NOTED    Chloride 106 101 - 111 mmol/L   CO2 25 22 - 32 mmol/L   Glucose, Bld 128 (H) 65 - 99 mg/dL   BUN 6 6 - 20 mg/dL   Creatinine, Ser 0.60 0.44 - 1.00 mg/dL   Calcium 8.8 (L) 8.9 - 10.3 mg/dL   GFR calc non Af Amer >60 >60 mL/min   GFR calc Af Amer >60 >60 mL/min    Comment: (NOTE) The eGFR has been calculated using the CKD EPI equation. This calculation has not been validated in all clinical situations. eGFR's persistently <60 mL/min signify possible Chronic Kidney Disease.    Anion gap 5 5 - 15  Glucose, capillary     Status: Abnormal   Collection Time: 12/25/16  8:30 AM  Result Value Ref Range   Glucose-Capillary 121 (H) 65 - 99 mg/dL    Treatments: IV hydration, steroids: Methylprednisolone, anti-emetics, and anti-reflux meds  [place] on 11/21:  SIUP at 17 4/7 weeks, normal fluid volume, FHR 157, cervix 3.11.  Discharge Exam: Blood pressure 112/63, pulse 80, temperature 97.7 F (36.5 C), temperature source Oral, resp. rate 18, height _0  (1.676 m), weight 133 kg (293 lb 4 oz), SpO2 100 %. General appearance: alert Resp: clear to auscultation bilaterally Cardio: regular rate and rhythm, S1, S2 normal, no murmur, click, rub or gallop GI: soft, non-tender; bowel sounds normal; no masses,  no organomegaly Extremities: extremities normal, atraumatic, no cyanosis or edema  Uterus 19 week FH, FHR 152 by auscultation.  Disposition: 01-Home or Self Care  Discharge Instructions    Discharge patient   Complete by:  As directed    Discharge disposition:  01-Home or Self Care   Discharge patient date:  12/25/2016     Allergies as of 12/25/2016   No Known Allergies     Medication List     STOP taking these medications   glycopyrrolate 1 MG tablet Commonly known as:  ROBINUL     TAKE these medications   acetaminophen 500 MG tablet Commonly known as:  TYLENOL Take 500 mg by mouth every 4 (four) hours as needed. For pain   famotidine  20 MG tablet Commonly known as:  PEPCID Take 1 tablet (20 mg total) by mouth daily.   methylPREDNISolone 4 MG tablet Commonly known as:  MEDROL Take tablets per steroid taper regimen--patient has guidelines for dosing over the next 2 weeks.   ondansetron 8 MG disintegrating tablet Commonly known as:  ZOFRAN-ODT Take 1 tablet (8 mg total) by mouth every 8 (eight) hours.      Follow-up Information    Gynecology, [facility]. Go in 1 week(s).   Specialty:  Obstetrics and Gynecology Why:  Keep appt as scheduled at [facility], call prn. Contact information: [place] [address] [phone]           Signed: Donnel Saxon 12/25/2016, 1:14 PM

## 2016-12-31 ENCOUNTER — Encounter (HOSPITAL_COMMUNITY): Payer: Self-pay | Admitting: Obstetrics & Gynecology

## 2017-01-05 ENCOUNTER — Ambulatory Visit (HOSPITAL_COMMUNITY)
Admission: RE | Admit: 2017-01-05 | Discharge: 2017-01-05 | Disposition: A | Discharge status: Home / Self Care | Payer: BLUE CROSS/BLUE SHIELD | Source: Ambulatory Visit | Attending: Obstetrics & Gynecology | Admitting: Obstetrics & Gynecology

## 2017-01-05 ENCOUNTER — Encounter (HOSPITAL_COMMUNITY): Payer: Self-pay

## 2017-01-05 DIAGNOSIS — Z3A19 19 weeks gestation of pregnancy: Secondary | ICD-10-CM | POA: Insufficient documentation

## 2017-01-05 DIAGNOSIS — O09299 Supervision of pregnancy with other poor reproductive or obstetric history, unspecified trimester: Secondary | ICD-10-CM | POA: Diagnosis not present

## 2017-01-05 DIAGNOSIS — Z3689 Encounter for other specified antenatal screening: Secondary | ICD-10-CM | POA: Diagnosis present

## 2017-01-05 DIAGNOSIS — O09892 Supervision of other high risk pregnancies, second trimester: Secondary | ICD-10-CM | POA: Diagnosis not present

## 2017-01-05 DIAGNOSIS — O99212 Obesity complicating pregnancy, second trimester: Secondary | ICD-10-CM | POA: Insufficient documentation

## 2017-01-05 DIAGNOSIS — O210 Mild hyperemesis gravidarum: Secondary | ICD-10-CM | POA: Diagnosis not present

## 2017-01-05 DIAGNOSIS — O09522 Supervision of elderly multigravida, second trimester: Secondary | ICD-10-CM | POA: Diagnosis not present

## 2017-01-05 DIAGNOSIS — O09529 Supervision of elderly multigravida, unspecified trimester: Secondary | ICD-10-CM

## 2017-01-05 NOTE — Progress Notes (Signed)
Genetic Counseling  High-Risk Gestation Note  Appointment Date:  01/05/2017 Referred By: Kimberly Hidalgozan, Jennifer, DO Date of Birth:  05/05/1975   Pregnancy History: G3P0111 Estimated Date of Delivery: 05/28/17 Estimated Gestational Age: 585w4d Attending: Particia NearingMartha Decker, MD  [name] was seen for genetic counseling because of a maternal age of 41 y.o..     In summary:  Discussed AMA and associated risk for fetal aneuploidy  Reviewed results of prior screening for aneuploidy  Quad screen within normal limits for Down syndrome, Trisomy 18, and ONTDs  While screen negative for conditions, consider offering third trimester growth scan given elevated analytes on Quad screen (hCG and DIA)  Discussed options for screening  NIPS- declined  Ultrasound- performed today; see separate report  Discussed diagnostic testing options  Amniocentesis- declined  Reviewed family history concerns  Discussed carrier screening options  CF- declined  SMA- declined  Hemoglobinopathies- negative hemoglobin solubility through OB office  She was counseled regarding maternal age and the association with risk for chromosome conditions due to nondisjunction with aging of the ova.   We reviewed chromosomes, nondisjunction, and the associated 1 in 5256 risk for fetal aneuploidy related to a maternal age of 41 y.o. at 115w4d gestation.  She was counseled that the risk for aneuploidy decreases as gestational age increases, accounting for those pregnancies which spontaneously abort.  We specifically discussed Down syndrome (trisomy 1021), trisomies 4813 and 2018, and sex chromosome aneuploidies (47,XXX and 47,XXY) including the common features and prognoses of each.   [name] previously had Quad screen through [facility], which was within normal limits for the conditions screened. Specifically, Quad screening reduced the risk for fetal Down syndrome (1 in 100 to 1 in 518), ONTDs (1 in 10,000), and was screen  negative for Trisomy 18. We reviewed that screening tests modify a patient's a priori risk for aneuploidy, typically based on age. We reviewed the detection rates of Quad screen. She understands that this screen is not diagnostic nor does it screen for all chromosome conditions. While screen negative for the conditions assessed, two of the analytes on the screen, hCG and DIA, were elevated (3.35 MoM and 2.26 MoM respectively). This has been associated with an increased risk for growth restriction or poor pregnancy outcome later in pregnancy; therefore, we would recommend a follow up ultrasound for fetal growth in the third trimester.  We reviewed additional available screening options including noninvasive prenatal screening (NIPS)/cell free DNA (cfDNA) screening and detailed ultrasound.  We reviewed the benefits and limitations of each option. Specifically, we discussed the conditions for which each test screens, the detection rates, and false positive rates of each. She was also counseled regarding diagnostic testing via amniocentesis. We reviewed the approximate 1 in 300-500 risk for complications from amniocentesis, including spontaneous pregnancy loss. We discussed the possible results that the tests might provide including: positive, negative, unanticipated, and no result. Finally, they were counseled regarding the cost of each option and potential out of pocket expenses.  A detailed ultrasound was performed today. The ultrasound report will be sent under separate cover. There were no visualized fetal anomalies or markers suggestive of aneuploidy. After careful consideration, [name] declined NIPS and amniocentesis, stating that she was comfortable with the risk assessment from ultrasound and Quad screen at this time. She understands that screening tests cannot rule out all birth defects or genetic syndromes. The patient was advised of this limitation and states she still does not want additional  testing at this time.  Both family histories were reviewed and found to be contributory for adult onset vision loss due to glaucoma. The patient reported that her father is currently 39 years old and has had vision loss in recent years related to glaucoma. She reported that her father's youngest sibling, his brother, also had vision loss with onset in late adulthood. No additional relatives were reported with vision loss. Glaucoma describes a group of disorders that can lead to vision loss due to damage to the optic nerve. Glaucoma can be seen as part of various genetic syndromes and can also be seen as an isolated condition. When isolated, glaucoma can be inherited in autosomal dominant, autosomal recessive, or multifactorial (combination of genetic and environmental factors) patterns. Without further information regarding the provided family history, an accurate genetic risk cannot be calculated. We discussed that it may be helpful for primary care physicians to be aware of this history so that the patient and other relatives are being screened appropriately.   [name] reported that her sister and her nephew have hemoglobin AS (sickle cell trait). 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 However, hemoglobin electrophoresis is also available to assess for other hemoglobin variants if not previously performed.   [name] also reported that her sister was diagnosed with breast cancer at age 39 years old. Though most cancers are thought to be sporadic or due to environmental factors, some families appear to have a strong predisposition to cancers.  When considering a family history of cancer, we look for common types of cancer in multiple family members occurring at younger than typical ages. [name] and her sister have the option of  meeting with a cancer genetic counselor to discuss any possible screening or testing options available. If they are concerned about the family history of cancer and would like to learn more about the family's chance for an inherited cancer syndrome, she can be referred to [facility] [phone]) or [facility] at [phone]. It would also be important for [name] to inform her physician regarding this history so that she can be screened appropriately. Further genetic counseling is warranted if more information is obtained.  [name] was provided with written information regarding cystic fibrosis (CF), spinal muscular atrophy (SMA) and hemoglobinopathies including the carrier frequency, availability of carrier screening and prenatal diagnosis if indicated.  In addition, we discussed that CF and hemoglobinopathies are routinely screened for as part of the [place] newborn screening panel.  She declined screening for CF, SMA and further screening for hemoglobinopathies.  [name] denied exposure to environmental toxins or chemical agents. She denied the use of alcohol, tobacco or street drugs. She denied significant viral illnesses during the course of her pregnancy.   I counseled [name] regarding the above risks and available options.  The approximate face-to-face time with the genetic counselor was 35 minutes.  Quinn PlowmanKaren Sadiya Durand, MS,  Certified Genetic Counselor 01/05/2017

## 2017-01-06 ENCOUNTER — Other Ambulatory Visit: Payer: Self-pay

## 2017-02-02 NOTE — L&D Delivery Note (Signed)
Delivery Note At 2:21 AM a viable female was delivered via Vaginal, Spontaneous (Presentation:occiput anterior  ;  ).Nuchal  Cord x 1 easily reduced   APGAR: 9, 9; weight pending.  .   Placenta status:complete  , 3 vessel .  Cord:  with the following complications: none .  Cord pH: NA  Anesthesia:  None Episiotomy: None Lacerations: Labial;Perineal;1st degree Suture Repair: 3.0 vicryl Est. Blood Loss (mL): 100  Mom to postpartum.  Baby to Couplet care / Skin to Skin.  Kimberly Joseph. 05/29/2017, 2:48 AM

## 2017-05-05 LAB — OB RESULTS CONSOLE GBS: STREP GROUP B AG: POSITIVE

## 2017-05-28 ENCOUNTER — Inpatient Hospital Stay (HOSPITAL_COMMUNITY)
Admission: AD | Admit: 2017-05-28 | Discharge: 2017-05-31 | DRG: 807 | Disposition: A | Discharge status: Home / Self Care | Payer: BLUE CROSS/BLUE SHIELD | Source: Ambulatory Visit | Attending: Obstetrics and Gynecology | Admitting: Obstetrics and Gynecology

## 2017-05-28 ENCOUNTER — Encounter (HOSPITAL_COMMUNITY): Payer: Self-pay | Admitting: *Deleted

## 2017-05-28 DIAGNOSIS — Z3A38 38 weeks gestation of pregnancy: Secondary | ICD-10-CM

## 2017-05-28 DIAGNOSIS — O99214 Obesity complicating childbirth: Secondary | ICD-10-CM | POA: Diagnosis present

## 2017-05-28 DIAGNOSIS — O134 Gestational [pregnancy-induced] hypertension without significant proteinuria, complicating childbirth: Secondary | ICD-10-CM | POA: Diagnosis present

## 2017-05-28 DIAGNOSIS — O139 Gestational [pregnancy-induced] hypertension without significant proteinuria, unspecified trimester: Secondary | ICD-10-CM | POA: Diagnosis present

## 2017-05-28 DIAGNOSIS — O99824 Streptococcus B carrier state complicating childbirth: Secondary | ICD-10-CM | POA: Diagnosis present

## 2017-05-28 HISTORY — DX: Gestational (pregnancy-induced) hypertension without significant proteinuria, unspecified trimester: O13.9

## 2017-05-28 LAB — COMPREHENSIVE METABOLIC PANEL
ALK PHOS: 174 U/L — AB (ref 38–126)
ALT: 18 U/L (ref 14–54)
AST: 22 U/L (ref 15–41)
Albumin: 2.8 g/dL — ABNORMAL LOW (ref 3.5–5.0)
Anion gap: 8 (ref 5–15)
BUN: 8 mg/dL (ref 6–20)
CALCIUM: 8.7 mg/dL — AB (ref 8.9–10.3)
CO2: 23 mmol/L (ref 22–32)
CREATININE: 0.81 mg/dL (ref 0.44–1.00)
Chloride: 105 mmol/L (ref 101–111)
GFR calc Af Amer: >60 mL/min (ref >60)
GFR calc non Af Amer: >60 mL/min (ref >60)
GLUCOSE: 94 mg/dL (ref 65–99)
Potassium: 4.1 mmol/L (ref 3.5–5.1)
SODIUM: 136 mmol/L (ref 135–145)
Total Bilirubin: 0.4 mg/dL (ref 0.3–1.2)
Total Protein: 6.4 g/dL — ABNORMAL LOW (ref 6.5–8.1)

## 2017-05-28 LAB — CBC
HCT: 36.4 % (ref 36.0–46.0)
HEMOGLOBIN: 12.2 g/dL (ref 12.0–15.0)
MCH: 26.8 pg (ref 26.0–34.0)
MCHC: 33.5 g/dL (ref 30.0–36.0)
MCV: 79.8 fL (ref 78.0–100.0)
Platelets: 243 10*3/uL (ref 150–400)
RBC: 4.56 MIL/uL (ref 3.87–5.11)
RDW: 15.7 % — ABNORMAL HIGH (ref 11.5–15.5)
WBC: 6.4 10*3/uL (ref 4.0–10.5)

## 2017-05-28 LAB — ABO/RH: ABO/RH(D): B POS

## 2017-05-28 LAB — PROTEIN / CREATININE RATIO, URINE
Creatinine, Urine: 42 mg/dL
Protein Creatinine Ratio: 0.26 mg/mg{Cre} — ABNORMAL HIGH (ref 0.00–0.15)
Total Protein, Urine: 11 mg/dL

## 2017-05-28 LAB — TYPE AND SCREEN
ABO/RH(D): B POS
Antibody Screen: NEGATIVE

## 2017-05-28 MED ORDER — OXYCODONE-ACETAMINOPHEN 5-325 MG PO TABS: 1.0000 | ORAL_TABLET | ORAL | Status: DC | PRN

## 2017-05-28 MED ORDER — OXYTOCIN 40 UNITS IN LACTATED RINGERS INFUSION - SIMPLE MED
2.5000 [IU]/h | INTRAVENOUS | Status: DC
  Filled 2017-05-28: qty 1000

## 2017-05-28 MED ORDER — LACTATED RINGERS IV SOLN: 500.0000 mL | INTRAVENOUS | Status: DC | PRN

## 2017-05-28 MED ORDER — SOD CITRATE-CITRIC ACID 500-334 MG/5ML PO SOLN: 30.0000 mL | ORAL | Status: DC | PRN

## 2017-05-28 MED ORDER — OXYCODONE-ACETAMINOPHEN 5-325 MG PO TABS: 2.0000 | ORAL_TABLET | ORAL | Status: DC | PRN

## 2017-05-28 MED ORDER — NIFEDIPINE 10 MG PO CAPS
10.0000 mg | ORAL_CAPSULE | ORAL | Status: DC | PRN
  Filled 2017-05-28: qty 2

## 2017-05-28 MED ORDER — PENICILLIN G POT IN DEXTROSE 60000 UNIT/ML IV SOLN
3.0000 10*6.[IU] | INTRAVENOUS | Status: DC
  Administered 2017-05-28 – 2017-05-29 (×2): 3 10*6.[IU] via INTRAVENOUS
  Filled 2017-05-28 (×5): qty 50

## 2017-05-28 MED ORDER — OXYTOCIN BOLUS FROM INFUSION
500.0000 mL | Freq: Once | INTRAVENOUS | Status: AC
  Administered 2017-05-29: 500 mL via INTRAVENOUS

## 2017-05-28 MED ORDER — BUTORPHANOL TARTRATE 1 MG/ML IJ SOLN: 1.0000 mg | INTRAMUSCULAR | Status: DC | PRN

## 2017-05-28 MED ORDER — ACETAMINOPHEN 325 MG PO TABS: 650.0000 mg | ORAL_TABLET | ORAL | Status: DC | PRN

## 2017-05-28 MED ORDER — LIDOCAINE HCL (PF) 1 % IJ SOLN
30.0000 mL | INTRAMUSCULAR | Status: AC | PRN
  Administered 2017-05-29: 30 mL via SUBCUTANEOUS
  Filled 2017-05-28: qty 30

## 2017-05-28 MED ORDER — SODIUM CHLORIDE 0.9 % IV SOLN
5.0000 10*6.[IU] | Freq: Once | INTRAVENOUS | Status: AC
  Administered 2017-05-28: 5 10*6.[IU] via INTRAVENOUS
  Filled 2017-05-28: qty 5

## 2017-05-28 MED ORDER — LACTATED RINGERS IV SOLN
INTRAVENOUS | Status: DC
  Administered 2017-05-28: 14:00:00 via INTRAVENOUS

## 2017-05-28 MED ORDER — MISOPROSTOL 25 MCG QUARTER TABLET
25.0000 ug | ORAL_TABLET | ORAL | Status: DC | PRN
  Administered 2017-05-28 (×2): 25 ug via VAGINAL
  Filled 2017-05-28 (×4): qty 1

## 2017-05-28 MED ORDER — TERBUTALINE SULFATE 1 MG/ML IJ SOLN: 0.2500 mg | Freq: Once | INTRAMUSCULAR | Status: DC | PRN

## 2017-05-28 MED ORDER — LABETALOL HCL 5 MG/ML IV SOLN: 40.0000 mg | Freq: Once | INTRAVENOUS | Status: DC | PRN

## 2017-05-28 MED ORDER — ONDANSETRON HCL 4 MG/2ML IJ SOLN: 4.0000 mg | Freq: Four times a day (QID) | INTRAMUSCULAR | Status: DC | PRN

## 2017-05-28 NOTE — MAU Note (Signed)
Pt sent from MD office, states her BP has been up since Friday, denies HA but has had blurred vision for the past 2 days.  No epigastric pain.  Denies contractions, bleeding or LOF.

## 2017-05-28 NOTE — Progress Notes (Signed)
Per Dr Richardson Doppole: Continue cytotec 25mcg per protocol until patient is 2cm.  Call Dr. Richardson Doppole if active or reaches 2 cm during the night. Pt may have light dinner.

## 2017-05-28 NOTE — H&P (Signed)
HPI: 42 y/o G3P1011 @ [redacted]w[redacted]d estimated gestational age (as dated by LMP c/w 8 week ultrasound) presents due to elevated BP in office, now for admit due to gestational HTN.   no Leaking of Fluid,   no Vaginal Bleeding,   irregular Uterine Contractions,  + Fetal Movement.  Prenatal care has been provided by Dr. Charlotta Newton  ROS: no HA, no epigastric pain, + visual changes yesterday, but nothing currently.    Pregnancy complicated by: 1) AMA- declined panorama, normal [place] and quad screen 2) Morbid Obesity: BMI: 48 3) Hyperemesis- s/p steroid taper and hospital admission- currently on protonix daily 4) h/o preeclampsia- declined ASA during this pregnancy  Prenatal Transfer Tool  Maternal Diabetes: No Genetic Screening: Normal Maternal Ultrasounds/Referrals: Normal Fetal Ultrasounds or other Referrals:  None Maternal Substance Abuse:  No Significant Maternal Medications:  Meds include: Protonix Significant Maternal Lab Results: Lab values include: Group B Strep positive   PNL:  GBS positive, Rub Immune, Hep B neg, RPR NR, HIV neg, GC/C neg, glucola:normal Blood type: B pos, antibody neg  Immunizations: Tdap: 03/24/2017 Flu: 12/24  OBHx: SAB x1 (6wk) 2010- NSVD- IOL @ 37wk due to preeclampsia, 4#, female PMHx:  none Meds:  PNV, Protonix Allergy:  No Known Allergies SurgHx: none SocHx:   no Tobacco, no  EtOH, no Illicit Drugs  O: BP (!) 145/82   Pulse 82   Temp 98.7 F (37.1 C) (Oral)   Resp 18   Wt (!) 137.4 kg (303 lb)   BMI 48.91 kg/m   BP range: 128-160/81/103  Gen. AAOx3, NAD Neck: normal Skin: no rashes CV.  RRR  No murmur.  Resp. CTAB, no wheeze or crackles. Abd. Gravid,  no tenderness,  no rigidity,  no guarding Extr.  no edema B/L , no calf tenderness, neg Homan's B/L Psych: mood appropriate FHT: 140 baseline, mod variability, + accels,  no decels Toco: occasional SVE: deferred, cytotec #1 placed   Labs:  Results for orders placed or performed during the hospital  encounter of 05/28/17 (from the past 24 hour(s))  Protein / creatinine ratio, urine     Status: Abnormal   Collection Time: 05/28/17  1:14 PM  Result Value Ref Range   Creatinine, Urine 42.00 mg/dL   Total Protein, Urine 11 mg/dL   Protein Creatinine Ratio 0.26 (H) 0.00 - 0.15 mg/mg[Cre]  CBC     Status: Abnormal   Collection Time: 05/28/17  1:46 PM  Result Value Ref Range   WBC 6.4 4.0 - 10.5 K/uL   RBC 4.56 3.87 - 5.11 MIL/uL   Hemoglobin 12.2 12.0 - 15.0 g/dL   HCT 16.1 09.6 - 04.5 %   MCV 79.8 78.0 - 100.0 fL   MCH 26.8 26.0 - 34.0 pg   MCHC 33.5 30.0 - 36.0 g/dL   RDW 40.9 (H) 81.1 - 91.4 %   Platelets 243 150 - 400 K/uL  Comprehensive metabolic panel     Status: Abnormal   Collection Time: 05/28/17  1:46 PM  Result Value Ref Range   Sodium 136 135 - 145 mmol/L   Potassium 4.1 3.5 - 5.1 mmol/L   Chloride 105 101 - 111 mmol/L   CO2 23 22 - 32 mmol/L   Glucose, Bld 94 65 - 99 mg/dL   BUN 8 6 - 20 mg/dL   Creatinine, Ser 7.82 0.44 - 1.00 mg/dL   Calcium 8.7 (L) 8.9 - 10.3 mg/dL   Total Protein 6.4 (L) 6.5 - 8.1 g/dL   Albumin  2.8 (L) 3.5 - 5.0 g/dL   AST 22 15 - 41 U/L   ALT 18 14 - 54 U/L   Alkaline Phosphatase 174 (H) 38 - 126 U/L   Total Bilirubin 0.4 0.3 - 1.2 mg/dL   GFR calc non Af Amer >60 >60 mL/min   GFR calc Af Amer >60 >60 mL/min   Anion gap 8 5 - 15  Type and screen [facility]     Status: None (Preliminary result)   Collection Time: 05/28/17  2:42 PM  Result Value Ref Range   ABO/RH(D) B POS    Antibody Screen PENDING    Sample Expiration      05/31/2017 Performed at [facility], [address] [place], [place] [address]     A/P:  42 y.o. [id number] @ 5316w5d EGA who presents for IOL due to gestational HTN -FWB:  NICHD Cat I FHTs -Labor: cytotec for cervical ripening -GBS: positive, plan for PCN when transitioning to active labor or with rupture of membranes -Pain management: IV or epidural upon request -Gestational HTN:  procardia protocol if needed, labs within normal limits, pt currently asymptomatic -Dr. Richardson Doppole covering  Myna HidalgoJennifer Kenedee Molesky, DO [phone] (cell) [phone] (office)

## 2017-05-29 ENCOUNTER — Encounter (HOSPITAL_COMMUNITY): Payer: Self-pay

## 2017-05-29 LAB — CBC
HCT: 37.7 % (ref 36.0–46.0)
HEMOGLOBIN: 12.7 g/dL (ref 12.0–15.0)
MCH: 26.5 pg (ref 26.0–34.0)
MCHC: 33.7 g/dL (ref 30.0–36.0)
MCV: 78.5 fL (ref 78.0–100.0)
PLATELETS: 238 10*3/uL (ref 150–400)
RBC: 4.8 MIL/uL (ref 3.87–5.11)
RDW: 15.6 % — ABNORMAL HIGH (ref 11.5–15.5)
WBC: 10.9 10*3/uL — AB (ref 4.0–10.5)

## 2017-05-29 LAB — RPR: RPR: NONREACTIVE

## 2017-05-29 MED ORDER — LORATADINE 10 MG PO TABS
10.0000 mg | ORAL_TABLET | Freq: Every day | ORAL | Status: DC
  Administered 2017-05-29 – 2017-05-31 (×3): 10 mg via ORAL
  Filled 2017-05-29 (×3): qty 1

## 2017-05-29 MED ORDER — WITCH HAZEL-GLYCERIN EX PADS: 1.0000 "application " | MEDICATED_PAD | CUTANEOUS | Status: DC | PRN

## 2017-05-29 MED ORDER — NIFEDIPINE ER OSMOTIC RELEASE 30 MG PO TB24
30.0000 mg | ORAL_TABLET | Freq: Every day | ORAL | Status: DC
  Administered 2017-05-29 – 2017-05-31 (×3): 30 mg via ORAL
  Filled 2017-05-29 (×3): qty 1

## 2017-05-29 MED ORDER — COCONUT OIL OIL
1.0000 "application " | TOPICAL_OIL | Status: DC | PRN
  Administered 2017-05-31: 1 via TOPICAL
  Filled 2017-05-29: qty 120

## 2017-05-29 MED ORDER — DIBUCAINE 1 % RE OINT: 1.0000 "application " | TOPICAL_OINTMENT | RECTAL | Status: DC | PRN

## 2017-05-29 MED ORDER — OXYTOCIN 40 UNITS IN LACTATED RINGERS INFUSION - SIMPLE MED: 1.0000 m[IU]/min | INTRAVENOUS | Status: DC

## 2017-05-29 MED ORDER — OXYCODONE HCL 5 MG PO TABS: 10.0000 mg | ORAL_TABLET | ORAL | Status: DC | PRN

## 2017-05-29 MED ORDER — IBUPROFEN 600 MG PO TABS
600.0000 mg | ORAL_TABLET | Freq: Four times a day (QID) | ORAL | Status: DC
  Administered 2017-05-29 – 2017-05-31 (×10): 600 mg via ORAL
  Filled 2017-05-29 (×10): qty 1

## 2017-05-29 MED ORDER — ACETAMINOPHEN 325 MG PO TABS
650.0000 mg | ORAL_TABLET | ORAL | Status: DC | PRN
  Administered 2017-05-29: 650 mg via ORAL
  Filled 2017-05-29: qty 2

## 2017-05-29 MED ORDER — OXYCODONE HCL 5 MG PO TABS
5.0000 mg | ORAL_TABLET | ORAL | Status: DC | PRN
  Administered 2017-05-29: 5 mg via ORAL
  Filled 2017-05-29: qty 1

## 2017-05-29 MED ORDER — BENZOCAINE-MENTHOL 20-0.5 % EX AERO
1.0000 | INHALATION_SPRAY | CUTANEOUS | Status: DC | PRN
Start: 2017-05-29 — End: 2017-05-31
  Filled 2017-05-29 (×2): qty 56

## 2017-05-29 MED ORDER — SIMETHICONE 80 MG PO CHEW: 80.0000 mg | CHEWABLE_TABLET | ORAL | Status: DC | PRN

## 2017-05-29 MED ORDER — TERBUTALINE SULFATE 1 MG/ML IJ SOLN
0.2500 mg | Freq: Once | INTRAMUSCULAR | Status: DC | PRN
  Filled 2017-05-29: qty 1

## 2017-05-29 MED ORDER — PRENATAL MULTIVITAMIN CH
1.0000 | ORAL_TABLET | Freq: Every day | ORAL | Status: DC
  Administered 2017-05-29 – 2017-05-31 (×3): 1 via ORAL
  Filled 2017-05-29 (×3): qty 1

## 2017-05-29 MED ORDER — SENNOSIDES-DOCUSATE SODIUM 8.6-50 MG PO TABS
2.0000 | ORAL_TABLET | ORAL | Status: DC
  Administered 2017-05-30: 2 via ORAL
  Filled 2017-05-29 (×2): qty 2

## 2017-05-29 MED ORDER — ONDANSETRON HCL 4 MG/2ML IJ SOLN: 4.0000 mg | INTRAMUSCULAR | Status: DC | PRN

## 2017-05-29 MED ORDER — ONDANSETRON HCL 4 MG PO TABS: 4.0000 mg | ORAL_TABLET | ORAL | Status: DC | PRN

## 2017-05-29 MED ORDER — ZOLPIDEM TARTRATE 5 MG PO TABS: 5.0000 mg | ORAL_TABLET | Freq: Every evening | ORAL | Status: DC | PRN

## 2017-05-29 MED ORDER — PANTOPRAZOLE SODIUM 40 MG PO TBEC
40.0000 mg | DELAYED_RELEASE_TABLET | Freq: Every day | ORAL | Status: DC
  Administered 2017-05-29 – 2017-05-31 (×3): 40 mg via ORAL
  Filled 2017-05-29 (×3): qty 1

## 2017-05-29 MED ORDER — DIPHENHYDRAMINE HCL 25 MG PO CAPS: 25.0000 mg | ORAL_CAPSULE | Freq: Four times a day (QID) | ORAL | Status: DC | PRN

## 2017-05-29 NOTE — Lactation Note (Signed)
This note was copied from a baby's chart. Lactation Consultation Note  Patient Name: Kimberly Joseph WUJWJ'X Date: 05/29/2017 Reason for consult: Initial assessment;Early term 37-38.6wks;Infant < 6lbs  G3P2 mother whose infant is now 50 hours of age.  Infant is 38+6 weeks and is just under 6 lbs.  Mother breastfed her 40 year old for 2 years.  Mother has visitors, however, infant has not attempted a feeding in 4 hours.  LC offered to assist with latch and mother agreed.  Mother has large compressible breast tissue and nipples are everted.  Mother prefers the side lying position so assisted infant to latch on left breast.  Infant took a few sucks and became sleepy.  With stimulation, she continued to take only 3-4 sucks before falling asleep.  Put infant STS,  Initiated DEBP for increasing milk volume and breast stimulation.  Encouraged mother to feed infant 8-12 times/24 hours or earlier if she shows feeding cues, breast massage, hand expression (mother did a return demonstration), how to awaken a sleepy baby and feeding intervals.  Advised that baby may be sleepy at this age and that she is an ETI and just under 6 lbs so mother needs to be diligent about feeding regularly and not for 45-60 min like she has been documenting.  Provided examples of what a good effective feeding would look like instead of just resting at the breast without actively sucking.  Mother verbalized understanding and desires to continue pumping after feeds.  Mother appreciative of knowledge given.  Instructions provided on pump, pump cycle and cleaning pump parts.  Mom made aware of O/P services, breastfeeding support groups, community resources, and our phone # for post-discharge questions. FOB and daughter present.  Mother will call as needed for assistance.  Maternal Data Formula Feeding for Exclusion: No Has patient been taught Hand Expression?: Yes Does the patient have breastfeeding experience prior to this  delivery?: Yes  Feeding Feeding Type: Breast Fed Length of feed: 10 min(infant very sleepy)  LATCH Score Latch: Repeated attempts needed to sustain latch, nipple held in mouth throughout feeding, stimulation needed to elicit sucking reflex.  Audible Swallowing: None  Type of Nipple: Everted at rest and after stimulation  Comfort (Breast/Nipple): Soft / non-tender  Hold (Positioning): Assistance needed to correctly position infant at breast and maintain latch.  LATCH Score: 6  Interventions Interventions: Breast feeding basics reviewed;Assisted with latch;Skin to skin;Breast massage;Hand express;Position options;Support pillows;Adjust position;DEBP  Lactation Tools Discussed/Used Tools: Pump;Flanges Flange Size: 27 Breast pump type: Double-Electric Breast Pump Pump Review: Setup, frequency, and cleaning;Milk Storage Initiated by:: Kimberly Joseph Date initiated:: 05/29/17   Consult Status Consult Status: Follow-up Date: 05/30/17 Follow-up type: In-patient    Dora Sims 05/29/2017, 6:52 PM

## 2017-05-30 NOTE — Progress Notes (Signed)
Post Partum Day 1SVD Subjective: no complaints, up ad lib, voiding and tolerating PO  Objective: Blood pressure 126/84, pulse 82, temperature 98 F (36.7 C), temperature source Oral, resp. rate 18, height  (1.702 m), weight (!) 137.4 kg (303 lb), SpO2 100 %, unknown if currently breastfeeding.  Physical Exam:  General: alert copperative no distress  Lochia: appropriate Uterine Fundus: firm Incision: NA DVT Evaluation: No evidence of DVT seen on physical exam.  Recent Labs    05/28/17 1346 05/29/17 0554  HGB 12.2 12.7  HCT 36.4 37.7    Assessment/Plan: Pt doing well PPD #1 s/p SVD Routine postpartum care  Gestational hypertension. -bp well controlled with procardia xl 30 mg daily  Plan discharge home tomorrow.  Dr. Myna Hidalgo to assume care in AM    LOS: 2 days   Nicholous Girgenti J. 05/30/2017, 10:32 AM

## 2017-05-31 MED ORDER — NIFEDIPINE ER 30 MG PO TB24: 30.0000 mg | ORAL_TABLET | Freq: Every day | ORAL | 6 refills | Status: DC

## 2017-05-31 MED ORDER — IBUPROFEN 600 MG PO TABS: 600.0000 mg | ORAL_TABLET | Freq: Four times a day (QID) | ORAL | 0 refills | Status: DC

## 2017-05-31 NOTE — Discharge Instructions (Signed)
Vaginal Delivery, Care After °Refer to this sheet in the next few weeks. These instructions provide you with information about caring for yourself after vaginal delivery. Your health care provider may also give you more specific instructions. Your treatment has been planned according to current medical practices, but problems sometimes occur. Call your health care provider if you have any problems or questions. °What can I expect after the procedure? °After vaginal delivery, it is common to have: °· Some bleeding from your vagina. °· Soreness in your abdomen, your vagina, and the area of skin between your vaginal opening and your anus (perineum). °· Pelvic cramps. °· Fatigue. ° °Follow these instructions at home: °Medicines °· Take over-the-counter and prescription medicines only as told by your health care provider. °· If you were prescribed an antibiotic medicine, take it as told by your health care provider. Do not stop taking the antibiotic until it is finished. °Driving ° °· Do not drive or operate heavy machinery while taking prescription pain medicine. °· Do not drive for 24 hours if you received a sedative. °Lifestyle °· Do not drink alcohol. This is especially important if you are breastfeeding or taking medicine to relieve pain. °· Do not use tobacco products, including cigarettes, chewing tobacco, or e-cigarettes. If you need help quitting, ask your health care provider. °Eating and drinking °· Drink at least 8 eight-ounce glasses of water every day unless you are told not to by your health care provider. If you choose to breastfeed your baby, you may need to drink more water than this. °· Eat high-fiber foods every day. These foods may help prevent or relieve constipation. High-fiber foods include: °? Whole grain cereals and breads. °? Brown rice. °? Beans. °? Fresh fruits and vegetables. °Activity °· Return to your normal activities as told by your health care provider. Ask your health care provider  what activities are safe for you. °· Rest as much as possible. Try to rest or take a nap when your baby is sleeping. °· Do not lift anything that is heavier than your baby or 10 lb (4.5 kg) until your health care provider says that it is safe. °· Talk with your health care provider about when you can engage in sexual activity. This may depend on your: °? Risk of infection. °? Rate of healing. °? Comfort and desire to engage in sexual activity. °Vaginal Care °· If you have an episiotomy or a vaginal tear, check the area every day for signs of infection. Check for: °? More redness, swelling, or pain. °? More fluid or blood. °? Warmth. °? Pus or a bad smell. °· Do not use tampons or douches until your health care provider says this is safe. °· Watch for any blood clots that may pass from your vagina. These may look like clumps of dark red, brown, or black discharge. °General instructions °· Keep your perineum clean and dry as told by your health care provider. °· Wear loose, comfortable clothing. °· Wipe from front to back when you use the toilet. °· Ask your health care provider if you can shower or take a bath. If you had an episiotomy or a perineal tear during labor and delivery, your health care provider may tell you not to take baths for a certain length of time. °· Wear a bra that supports your breasts and fits you well. °· If possible, have someone help you with household activities and help care for your baby for at least a few days after   you leave the hospital. °· Keep all follow-up visits for you and your baby as told by your health care provider. This is important. °Contact a health care provider if: °· You have: °? Vaginal discharge that has a bad smell. °? Difficulty urinating. °? Pain when urinating. °? A sudden increase or decrease in the frequency of your bowel movements. °? More redness, swelling, or pain around your episiotomy or vaginal tear. °? More fluid or blood coming from your episiotomy or  vaginal tear. °? Pus or a bad smell coming from your episiotomy or vaginal tear. °? A fever. °? A rash. °? Little or no interest in activities you used to enjoy. °? Questions about caring for yourself or your baby. °· Your episiotomy or vaginal tear feels warm to the touch. °· Your episiotomy or vaginal tear is separating or does not appear to be healing. °· Your breasts are painful, hard, or turn red. °· You feel unusually sad or worried. °· You feel nauseous or you vomit. °· You pass large blood clots from your vagina. If you pass a blood clot from your vagina, save it to show to your health care provider. Do not flush blood clots down the toilet without having your health care provider look at them. °· You urinate more than usual. °· You are dizzy or light-headed. °· You have not breastfed at all and you have not had a menstrual period for 12 weeks after delivery. °· You have stopped breastfeeding and you have not had a menstrual period for 12 weeks after you stopped breastfeeding. °Get help right away if: °· You have: °? Pain that does not go away or does not get better with medicine. °? Chest pain. °? Difficulty breathing. °? Blurred vision or spots in your vision. °? Thoughts about hurting yourself or your baby. °· You develop pain in your abdomen or in one of your legs. °· You develop a severe headache. °· You faint. °· You bleed from your vagina so much that you fill two sanitary pads in one hour. °This information is not intended to replace advice given to you by your health care provider. Make sure you discuss any questions you have with your health care provider. °Document Released: 01/17/2000 Document Revised: 07/03/2015 Document Reviewed: 02/03/2015 °[employer]vier Interactive Patient Education © 2018 [employer] ° °

## 2017-05-31 NOTE — Discharge Summary (Signed)
OB Discharge Summary     Patient Name: Kimberly Joseph DOB: 04-Oct-1975 MRN: [id number]  Date of admission: 05/28/2017 Delivering MD: Gerald Leitz   Date of discharge: 05/31/2017  Admitting diagnosis: 38.5WKS BLURRY VISION, BP Intrauterine pregnancy: [redacted]w[redacted]d     Secondary diagnosis:  Active Problems:   Gestational hypertension  Additional problems: morbid obesity, hyperemesis     Discharge diagnosis: Term Pregnancy Delivered and Gestational Hypertension                                                                                                Post partum procedures:none  Augmentation: Cytotec  Complications: None  Hospital course:  Induction of Labor With Vaginal Delivery   42 y.o. yo [id number] at [redacted]w[redacted]d was admitted to the hospital 05/28/2017 for induction of labor.  Indication for induction: Gestational hypertension.  Patient had an uncomplicated labor course as follows: Membrane Rupture Time/Date: 1:32 AM ,05/29/2017   Intrapartum Procedures: Episiotomy: None [1]                                         Lacerations:  Labial [10];Perineal [11];1st degree [2]  Patient had delivery of a Viable infant.  Information for the patient's newborn:  STACY, SAILER [[id number]]  Delivery Method: Vaginal, Spontaneous(Filed from Delivery Summary)   05/29/2017  Details of delivery can be found in separate delivery note.  Patient had a routine postpartum course. Patient is discharged home 05/31/17.  Physical exam  Vitals:   05/30/17 1834 05/30/17 1929 05/30/17 2045 05/31/17 0601  BP: (!) 157/92 (!) 144/95 (!) 147/99 125/89  Pulse: 89 98  88  Resp: Temp: (!) 97.3 F (36.3 C)   98.2 F (36.8 C)  TempSrc: Oral   Oral  SpO2:      Weight:      Height:       General: alert, cooperative and no distress Lochia: appropriate Uterine Fundus: firm Incision: N/A DVT Evaluation: No evidence of DVT seen on physical exam. Labs: Lab Results  Component Value Date   WBC 10.9  (H) 05/29/2017   HGB 12.7 05/29/2017   HCT 37.7 05/29/2017   MCV 78.5 05/29/2017   PLT 238 05/29/2017   CMP Latest Ref Rng & Units 05/28/2017  Glucose 65 - 99 mg/dL 94  BUN 6 - 20 mg/dL 8  Creatinine 1.30 - 8.65 mg/dL 7.84  Sodium 696 - 295 mmol/L 136  Potassium 3.5 - 5.1 mmol/L 4.1  Chloride 101 - 111 mmol/L 105  CO2 22 - 32 mmol/L 23  Calcium 8.9 - 10.3 mg/dL 2.8(U)  Total Protein 6.5 - 8.1 g/dL 6.4(L)  Total Bilirubin 0.3 - 1.2 mg/dL 0.4  Alkaline Phos 38 - 126 U/L 174(H)  AST 15 - 41 U/L 22  ALT 14 - 54 U/L 18    Discharge instruction: per After Visit Summary and "Baby and Me Booklet".  After visit meds:  Allergies as of 05/31/2017   No Known Allergies  Medication List    STOP taking these medications   acetaminophen 500 MG tablet Commonly known as:  TYLENOL   famotidine 20 MG tablet Commonly known as:  PEPCID   methylPREDNISolone 4 MG tablet Commonly known as:  MEDROL   ondansetron 8 MG disintegrating tablet Commonly known as:  ZOFRAN-ODT   pantoprazole 40 MG tablet Commonly known as:  PROTONIX     TAKE these medications   cetirizine 10 MG tablet Commonly known as:  ZYRTEC Take 10 mg by mouth daily as needed for allergies.   ibuprofen 600 MG tablet Commonly known as:  ADVIL,MOTRIN Take 1 tablet (600 mg total) by mouth every 6 (six) hours.   NIFEdipine 30 MG 24 hr tablet Commonly known as:  PROCARDIA-XL/ADALAT CC Take 1 tablet (30 mg total) by mouth daily.   PNV PO Take 1 tablet by mouth daily.       Diet: routine diet  Activity: Advance as tolerated. Pelvic rest for 6 weeks.   Outpatient follow up: 1 weeks Follow up Appt:No future appointments. Follow up Visit:No follow-ups on file.  Postpartum contraception: Not Discussed  Newborn Data: Live born female  Birth Weight: 5 lb 15.1 oz (2696 g) APGAR: 9, 9  Newborn Delivery   Birth date/time:  05/29/2017 02:21:00 Delivery type:  Vaginal, Spontaneous     Baby Feeding:  Breast Disposition:home with mother   05/31/2017 Sharon Seller, DO

## 2017-05-31 NOTE — Progress Notes (Signed)
Post Partum Day 2 SVD Subjective: Resting comfortably in bed.  No headache, blurry vision or RUQ pain.  Tolerating gen diet, +flatus, no BM. Lochia moderate.  No F/C/CP/SOB.  Ambulating and voiding freely.  Objective: Blood pressure 125/89, pulse 88, temperature 98.2 F (36.8 C), temperature source Oral, resp. rate 16, height  (1.702 m), weight (!) 137.4 kg (303 lb), SpO2 100 %, unknown if currently breastfeeding.  Physical Exam:  General: alert copperative no distress  CV: RRR Lungs: CTAB Abd: soft, non-tender, Uterine Fundus: firm DVT Evaluation: No evidence of DVT seen on physical exam.  Recent Labs    05/28/17 1346 05/29/17 0554  HGB 12.2 12.7  HCT 36.4 37.7    Assessment/Plan: Pt doing well PPD #2 s/p SVD Routine postpartum care  Gestational hypertension. -bp well controlled with procardia xl 30 mg daily  Plan discharge home today  Myna Hidalgo, DO [phone] (cell) [phone] (office)

## 2017-05-31 NOTE — Lactation Note (Signed)
This note was copied from a baby's chart. Lactation Consultation Note  Patient Name: Kimberly Joseph ZOXWR'U Date: 05/31/2017   G3P2 mother whose infant is now 47 hours old.  This is a 38+6 week baby who weighs 5 lbs-10 oz  Mother had just finished breastfeeding infant when [facility] arrived.  Mother stated that she prefers the side lying position and has some tenderness to nipples.  The nipples are intact with no breakdown noted.  Provided comfort gels with instructions for use.  Mother supplementing with formula but amounts are small.  LC advised to increase the amount of supplementation to 20-30 mls.  Mother had stopped at 10 mls.  Encouraged her to burp baby and LC remained in room to observe feeding.  Mother reminded how to do paced bottle feeding and infant took 28 mls with ease and burped well.  Reminded her to awaken infant at 3 hours to feed again.  Mother to call for assistance as needed.               Jerriah Ines R Tecora Eustache 05/31/2017, 4:59 AM

## 2017-05-31 NOTE — Lactation Note (Signed)
This note was copied from a baby's chart. Lactation Consultation Note Mother complaints of painful latch. Observed positional strips bilaterally. Mother assist with latching infant in good position. Mother taught football hold and cross cradle hold. Mother has been doing mostly sidelying. Infant has been slipping to the tip of the nipple.  Mother advised to hand express,  firm nipple and use nipple to nose latch technique. Mother was given a hand pump with instructions to use as needed. Mother is able to express sprays of ebm. Mother advised to continue to do skin to skin and feed infant 8-12 times in 24 hours.  Discussed treatment and prevention of engorgement . Discussed cluster feeding. Mother receptive to all teaching. Mother is aware of available LC services and community .   Patient Name: Kimberly Joseph ZOXWR'U Date: 05/31/2017 Reason for consult: Follow-up assessment   Maternal Data    Feeding Feeding Type: Breast Fed Length of feed: 5 min(on and off with a few shallow sucks and swallows)  LATCH Score Latch: Repeated attempts needed to sustain latch, nipple held in mouth throughout feeding, stimulation needed to elicit sucking reflex.  Audible Swallowing: A few with stimulation  Type of Nipple: Everted at rest and after stimulation  Comfort (Breast/Nipple): Filling, red/small blisters or bruises, mild/mod discomfort  Hold (Positioning): Assistance needed to correctly position infant at breast and maintain latch.  LATCH Score: 6  Interventions Interventions: Assisted with latch;Skin to skin;Hand express;Pre-pump if needed;Adjust position;Support pillows;Position options;DEBP  Lactation Tools Discussed/Used     Consult Status Consult Status: Follow-up Date: 05/31/17 Follow-up type: In-patient    Stevan Born [facility] 05/31/2017, 11:12 AM

## 2018-11-06 IMAGING — US US MFM OB LIMITED
1 series · 15 of 24 positions shown · non-contrast
Comparison: none

[Series 1: us mfm ob limited · 24 acquisitions, 15 frames shown]
[im 1/24]
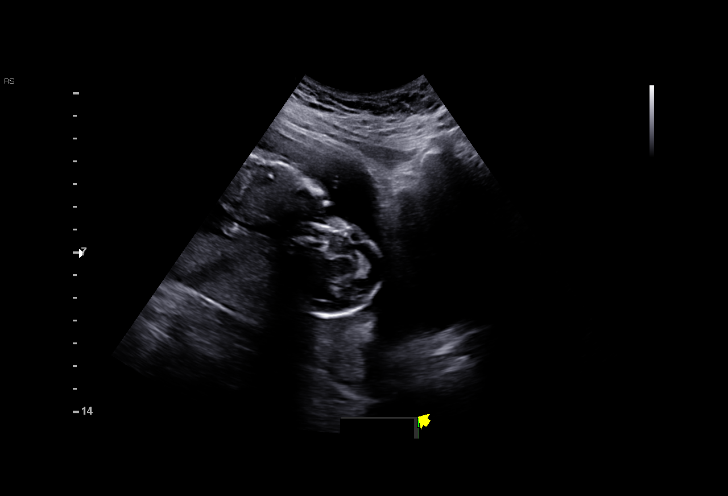
[im 3/24]
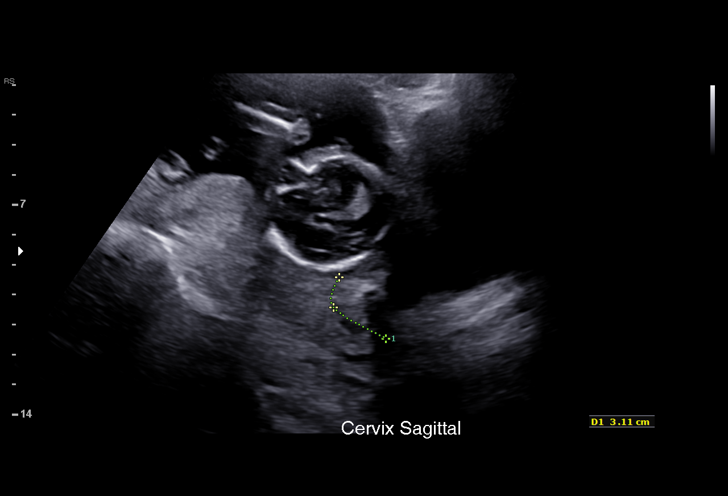
[im 5/24]
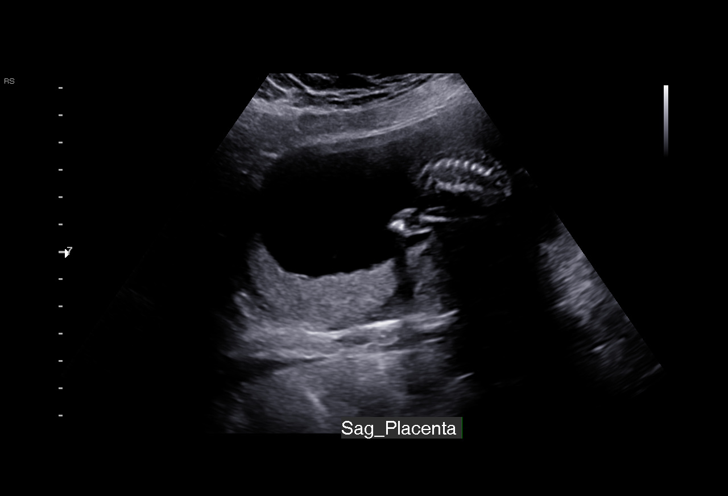
[im 6/24]
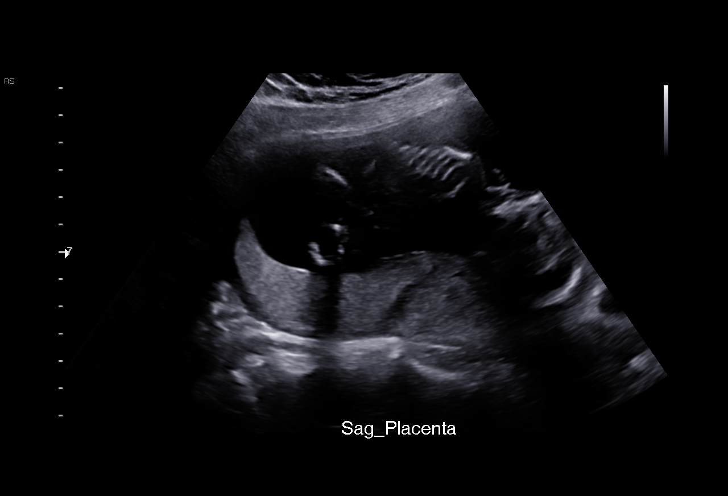
[im 8/24]
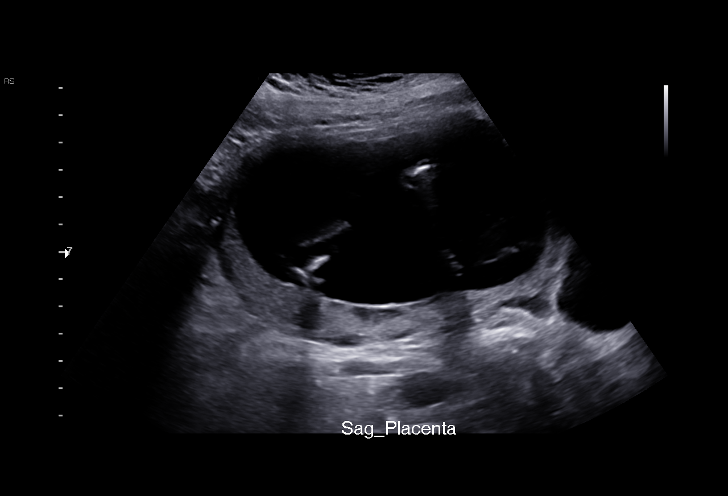
[im 9/24]
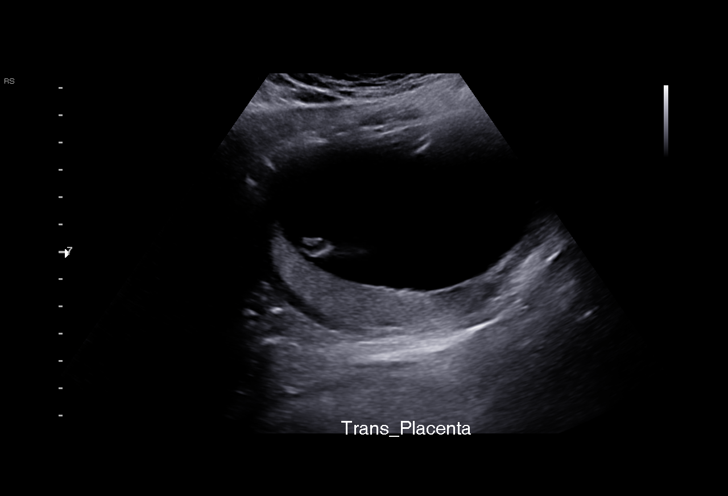
[im 11/24]
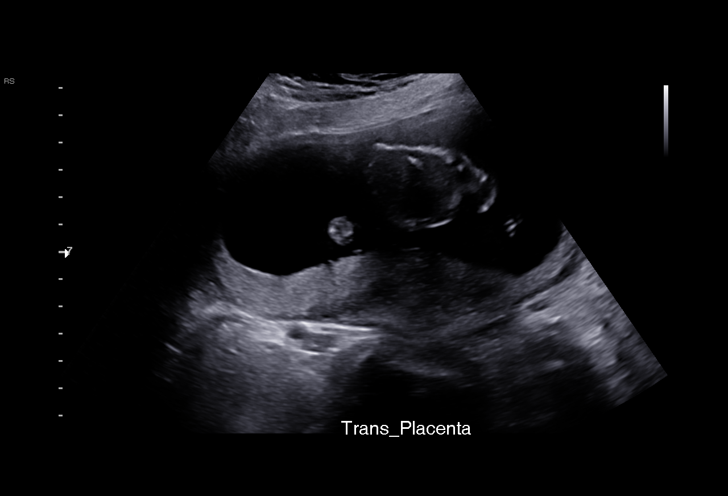
[im 13/24]
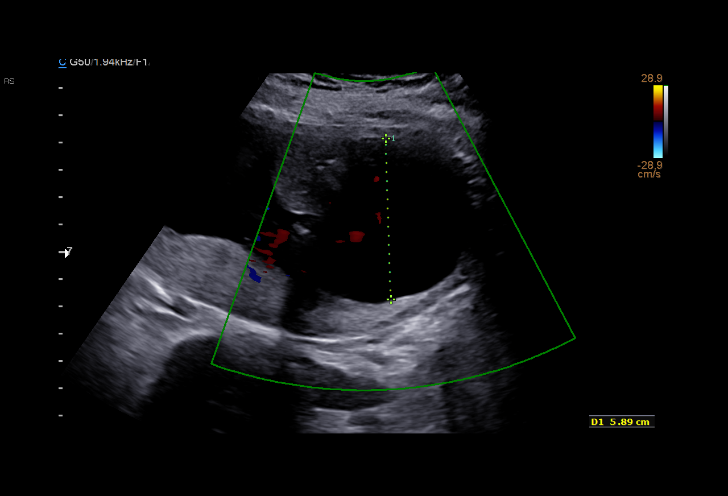
[im 14/24]
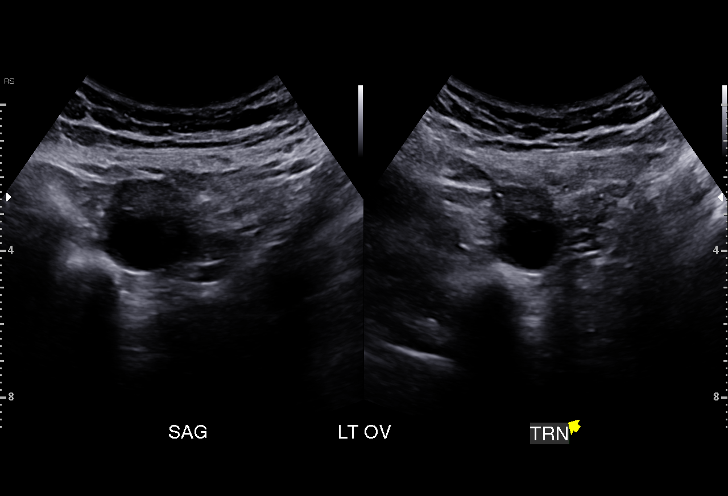
[im 16/24]
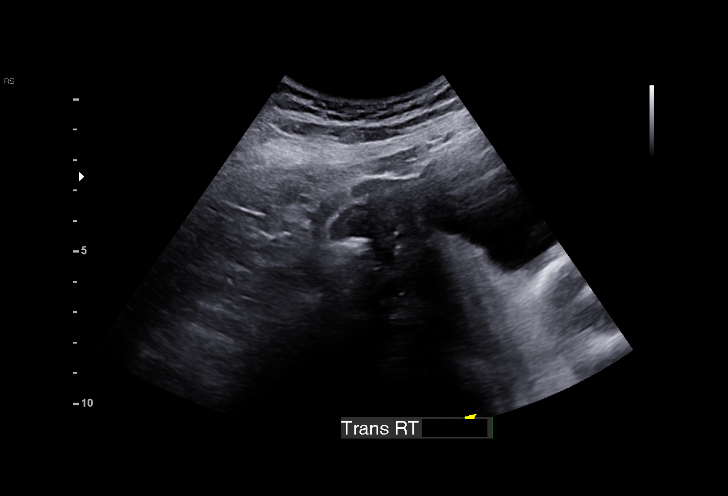
[im 17/24]
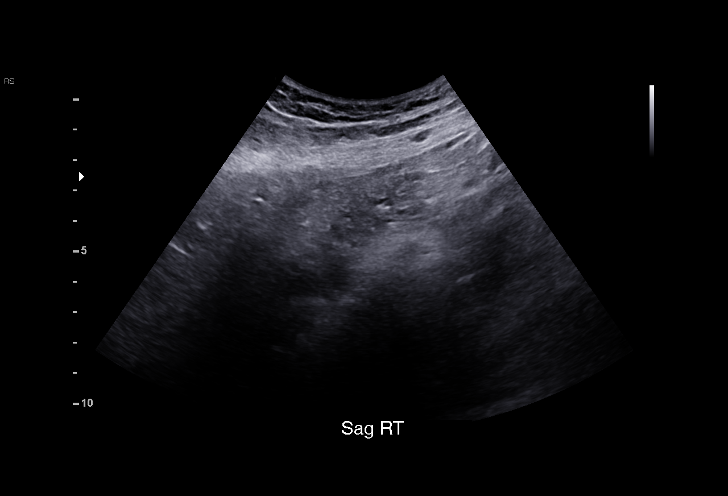
[im 19/24]
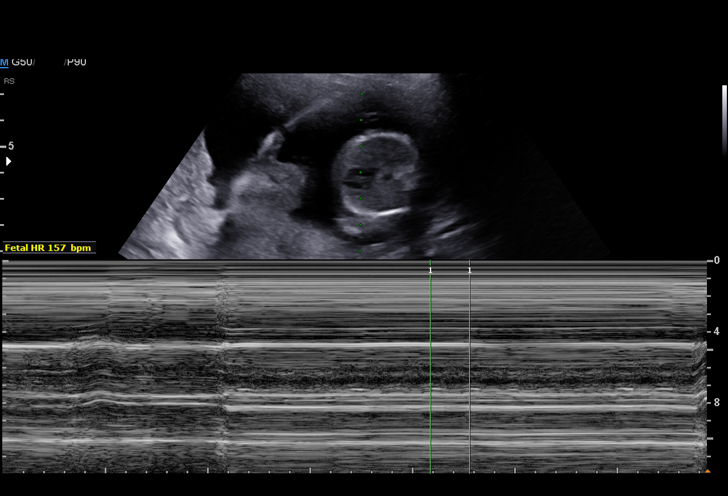
[im 21/24]
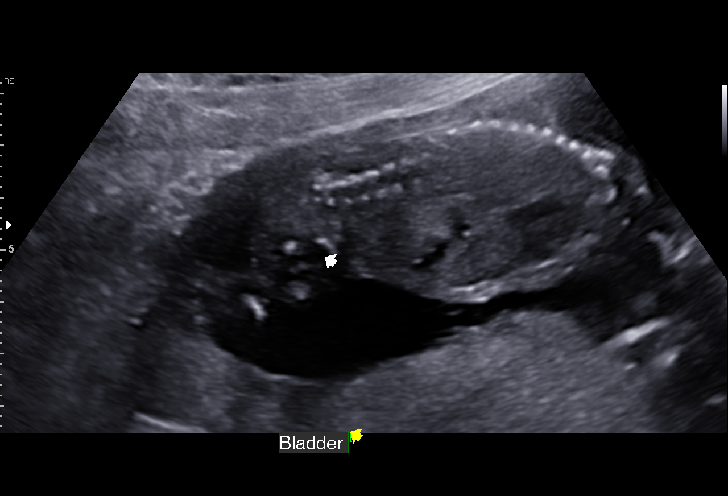
[im 22/24]
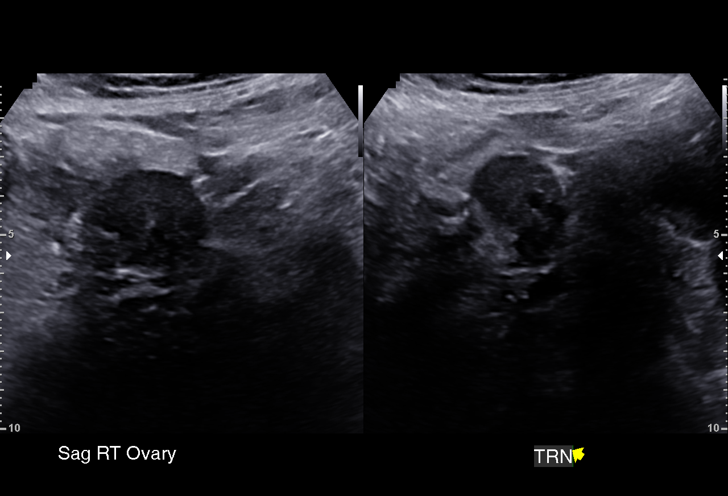
[im 24/24]
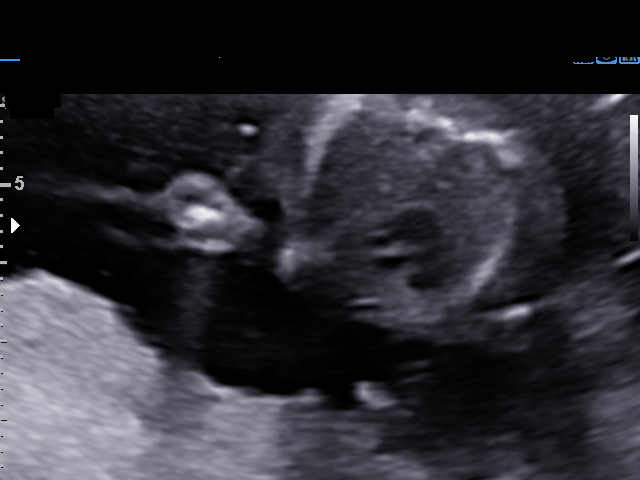

[15 of 24 positions shown; findings below may reference images not displayed]

Indications

17 weeks gestation of pregnancy
Absent fetal heart tones                       O76
Obesity complicating pregnancy, second
trimester
Hyperemesis gravidarum
Advanced maternal age multigravida 35+,
second trimester
OB History

Gravidity:    3
Living:       1
Fetal Evaluation

Num Of Fetuses:     1
Fetal Heart         157
Rate(bpm):
Cardiac Activity:   Observed
Presentation:       Cephalic
Placenta:           Posterior

Amniotic Fluid
AFI FV:      Subjectively within normal limits

Largest Pocket(cm)
5.89
Gestational Age
Best:          17w 4d     Det. By:  Previous Ultrasound      EDD:    05/29/17
Anatomy

Stomach:               Appears normal, left   Bladder:                Appears normal
sided
Cervix Uterus Adnexa

Cervix
Length:           3.11  cm.
Normal appearance by transabdominal scan.

Uterus
No abnormality visualized.

Left Ovary
Within normal limits.

Right Ovary
Within normal limits.

Adnexa:       No abnormality visualized. No adnexal mass
visualized.
Impression

SIUP at 17+4 weeks
Cardiac activity
Normal amniotic fluid volume
Recommendations

Follow-up as clinically indicated

## 2018-12-23 ENCOUNTER — Other Ambulatory Visit: Payer: Self-pay | Admitting: Obstetrics & Gynecology

## 2018-12-23 DIAGNOSIS — Z1231 Encounter for screening mammogram for malignant neoplasm of breast: Secondary | ICD-10-CM

## 2019-05-10 ENCOUNTER — Ambulatory Visit
Admission: RE | Admit: 2019-05-10 | Discharge: 2019-05-10 | Disposition: A | Discharge status: Home / Self Care | Payer: No Typology Code available for payment source | Source: Ambulatory Visit | Attending: Obstetrics and Gynecology | Admitting: Obstetrics and Gynecology

## 2019-05-10 ENCOUNTER — Other Ambulatory Visit: Payer: Self-pay | Admitting: Obstetrics and Gynecology

## 2019-05-10 ENCOUNTER — Other Ambulatory Visit: Payer: Self-pay

## 2019-05-10 DIAGNOSIS — R5383 Other fatigue: Secondary | ICD-10-CM

## 2020-04-19 ENCOUNTER — Other Ambulatory Visit: Payer: Self-pay | Admitting: Physician Assistant

## 2020-04-19 ENCOUNTER — Ambulatory Visit
Admission: RE | Admit: 2020-04-19 | Discharge: 2020-04-19 | Disposition: A | Discharge status: Home / Self Care | Payer: 59 | Source: Ambulatory Visit | Attending: Physician Assistant | Admitting: Physician Assistant

## 2020-04-19 DIAGNOSIS — R519 Headache, unspecified: Secondary | ICD-10-CM

## 2020-04-23 ENCOUNTER — Other Ambulatory Visit: Payer: Self-pay | Admitting: Obstetrics and Gynecology

## 2020-04-23 DIAGNOSIS — N915 Oligomenorrhea, unspecified: Secondary | ICD-10-CM

## 2020-05-08 ENCOUNTER — Other Ambulatory Visit: Payer: 59

## 2021-01-10 ENCOUNTER — Other Ambulatory Visit: Payer: Self-pay | Admitting: Family Medicine

## 2021-01-10 ENCOUNTER — Ambulatory Visit
Admission: RE | Admit: 2021-01-10 | Discharge: 2021-01-10 | Disposition: A | Discharge status: Home / Self Care | Payer: 59 | Source: Ambulatory Visit | Attending: Family Medicine | Admitting: Family Medicine

## 2021-01-10 DIAGNOSIS — R109 Unspecified abdominal pain: Secondary | ICD-10-CM

## 2021-03-07 ENCOUNTER — Other Ambulatory Visit: Payer: Self-pay | Admitting: Family Medicine

## 2021-03-07 DIAGNOSIS — R102 Pelvic and perineal pain: Secondary | ICD-10-CM

## 2021-03-07 DIAGNOSIS — R1031 Right lower quadrant pain: Secondary | ICD-10-CM

## 2021-03-13 ENCOUNTER — Other Ambulatory Visit: Payer: Self-pay

## 2021-03-13 ENCOUNTER — Ambulatory Visit (INDEPENDENT_AMBULATORY_CARE_PROVIDER_SITE_OTHER): Payer: PRIVATE HEALTH INSURANCE

## 2021-03-13 DIAGNOSIS — R102 Pelvic and perineal pain: Secondary | ICD-10-CM | POA: Diagnosis not present

## 2021-03-13 DIAGNOSIS — R1031 Right lower quadrant pain: Secondary | ICD-10-CM

## 2021-03-13 MED ORDER — IOHEXOL 300 MG/ML  SOLN
125.0000 mL | Freq: Once | INTRAMUSCULAR | Status: AC | PRN
  Administered 2021-03-13: 125 mL via INTRAVENOUS

## 2021-04-11 ENCOUNTER — Other Ambulatory Visit: Payer: Self-pay

## 2021-04-11 MED ORDER — WEGOVY 0.25 MG/0.5ML ~~LOC~~ SOAJ
SUBCUTANEOUS | 0 refills | Status: DC
  Filled 2021-04-11: qty 2, 30d supply, fill #0
  Filled 2021-07-08: qty 2, 28d supply, fill #0

## 2021-04-13 ENCOUNTER — Other Ambulatory Visit: Payer: Self-pay

## 2021-04-13 ENCOUNTER — Ambulatory Visit (HOSPITAL_COMMUNITY)
Admission: EM | Admit: 2021-04-13 | Discharge: 2021-04-13 | Disposition: A | Discharge status: Home / Self Care | Payer: 59 | Attending: Student | Admitting: Student

## 2021-04-13 ENCOUNTER — Encounter (HOSPITAL_COMMUNITY): Payer: Self-pay

## 2021-04-13 DIAGNOSIS — S29019A Strain of muscle and tendon of unspecified wall of thorax, initial encounter: Secondary | ICD-10-CM

## 2021-04-13 LAB — POC URINE PREG, ED: Preg Test, Ur: NEGATIVE

## 2021-04-13 MED ORDER — METHYLPREDNISOLONE SODIUM SUCC 125 MG IJ SOLR
INTRAMUSCULAR | Status: AC
  Filled 2021-04-13: qty 2

## 2021-04-13 MED ORDER — LIDOCAINE 4 % EX PTCH: 1.0000 | MEDICATED_PATCH | Freq: Every day | CUTANEOUS | 0 refills | Status: DC | PRN

## 2021-04-13 MED ORDER — METHYLPREDNISOLONE SODIUM SUCC 125 MG IJ SOLR
80.0000 mg | Freq: Once | INTRAMUSCULAR | Status: AC
  Administered 2021-04-13: 80 mg via INTRAMUSCULAR

## 2021-04-13 MED ORDER — TIZANIDINE HCL 4 MG PO CAPS: 4.0000 mg | ORAL_CAPSULE | Freq: Three times a day (TID) | ORAL | 0 refills | Status: DC

## 2021-04-13 NOTE — ED Triage Notes (Signed)
Pt presents with left side pain that started yesterday. No history of falls.  ?

## 2021-04-13 NOTE — Discharge Instructions (Addendum)
-  Start the muscle relaxer-Zanaflex (tizanidine), up to 3 times daily for muscle spasms and pain.  This can make you drowsy, so take at bedtime or when you do not need to drive or operate machinery. ?-Lidocaine patch as needed for pain - do not use heating pad while using lidocaine patch  ?-You can take Tylenol up to 1000 mg 3 times daily, and ibuprofen up to 600 mg 3 times daily with food.  You can take these together, or alternate every 3-4 hours. ?-Heating pad, warm shower, gentle stretching  ? ?

## 2021-04-13 NOTE — ED Provider Notes (Signed)
?[facility] ? ? ? ?CSN: [id number] ?Arrival date & time: 04/13/21  1025 ? ? ?  ? ?History   ?Chief Complaint ?Chief Complaint  ?Patient presents with  ? Flank Pain  ? ? ?HPI ?Kimberly Joseph is a 46 y.o. female presenting with left side pain x2 days. History obesity.  Describes left side pain with movement for about 2 days. Denies trauma does endorse overuse as she does heavy lifting both on the job and at home.  Pain is tolerable at rest, but worse with movement and twisting.  Has attempted Tylenol with minimal relief.  Denies urinary symptoms including hematuria, frequency, urgency, dysuria, fever/chills.  ? ?HPI ? ?Past Medical History:  ?Diagnosis Date  ? Pregnancy induced hypertension   ? ? ?Patient Active Problem List  ? Diagnosis Date Noted  ? Gestational hypertension 05/28/2017  ? [redacted] weeks gestation of pregnancy   ? AMA (advanced maternal age) multigravida 35+ 12/25/2016  ? Obesity, Class III, BMI 40-49.9 (morbid obesity) (HCC) 12/25/2016  ? History of pre-eclampsia 12/25/2016  ? Acid reflux 12/25/2016  ? Hyperemesis affecting pregnancy, antepartum 12/23/2016  ? ? ?Past Surgical History:  ?Procedure Laterality Date  ? NO PAST SURGERIES    ? ? ?OB History   ? ? Gravida  ?3  ? Para  ?2  ? Term  ?1  ? Preterm  ?1  ? AB  ?1  ? Living  ?2  ?  ? ? SAB  ?   ? IAB  ?   ? Ectopic  ?   ? Multiple  ?0  ? Live Births  ?1  ?   ?  ?  ? ? ? ?Home Medications   ? ?Prior to Admission medications   ?Medication Sig Start Date End Date Taking? Authorizing Provider  ?Lidocaine (HM LIDOCAINE PATCH) 4 % PTCH Apply 1 patch topically daily as needed. 04/13/21  Yes Rhys Martini, PA-C  ?tiZANidine (ZANAFLEX) 4 MG capsule Take 1 capsule (4 mg total) by mouth 3 (three) times daily. 04/13/21  Yes Rhys Martini, PA-C  ?cetirizine (ZYRTEC) 10 MG tablet Take 10 mg by mouth daily as needed for allergies.    [provider]  ?ibuprofen (ADVIL,MOTRIN) 600 MG tablet Take 1 tablet (600 mg total) by mouth every 6 (six)  hours. 05/31/17   Myna Hidalgo, DO  ?NIFEdipine (PROCARDIA-XL/ADALAT CC) 30 MG 24 hr tablet Take 1 tablet (30 mg total) by mouth daily. 05/31/17   Myna Hidalgo, DO  ?Prenatal Vit w/Fe-Methylfol-FA (PNV PO) Take 1 tablet by mouth daily.     [provider]  ?Semaglutide-Weight Management (WEGOVY) 0.25 MG/0.5ML SOAJ Inject 0.5 mL subcutaneously weekly 04/11/21     ? ? ?Family History ?Family History  ?Problem Relation Age of Onset  ? Asthma Mother   ? Hypertension Mother   ? Miscarriages / [place] Mother   ? Hypertension Father   ? Vision loss Father   ? Stroke Maternal Grandfather   ? ? ?Social History ?Social History  ? ?Tobacco Use  ? Smoking status: Never  ? Smokeless tobacco: Never  ?Substance Use Topics  ? Alcohol use: No  ? Drug use: No  ? ? ? ?Allergies   ?Patient has no known allergies. ? ? ?Review of Systems ?Review of Systems  ?Musculoskeletal:  Positive for back pain.  ?All other systems reviewed and are negative. ? ? ?Physical Exam ?Triage Vital Signs ?ED Triage Vitals  ?Enc Vitals Group  ?   BP 04/13/21 1050 (!)  129/96  ?   Pulse Rate 04/13/21 1050 89  ?   Resp 04/13/21 1050 18  ?   Temp 04/13/21 1050 98.6 ?F (37 ?C)  ?   Temp Source 04/13/21 1050 Oral  ?   SpO2 04/13/21 1050 100 %  ?   Weight --   ?   Height --   ?   Head Circumference --   ?   Peak Flow --   ?   Pain Score 04/13/21 1047 8  ?   Pain Loc --   ?   Pain Edu? --   ?   Excl. in GC? --   ? ?No data found. ? ?Updated Vital Signs ?BP (!) 129/96 (BP Location: Left Arm)   Pulse 89   Temp 98.6 ?F (37 ?C) (Oral)   Resp 18   LMP 04/03/2021 (Approximate)   SpO2 100%  ? ?Visual Acuity ?Right Eye Distance:   ?Left Eye Distance:   ?Bilateral Distance:   ? ?Right Eye Near:   ?Left Eye Near:    ?Bilateral Near:    ? ?Physical Exam ?Vitals reviewed.  ?Constitutional:   ?   General: She is not in acute distress. ?   Appearance: Normal appearance. She is obese. She is not ill-appearing.  ?HENT:  ?   Head: Normocephalic and atraumatic.   ?Cardiovascular:  ?   Rate and Rhythm: Normal rate and regular rhythm.  ?   Heart sounds: Normal heart sounds.  ?Pulmonary:  ?   Effort: Pulmonary effort is normal.  ?   Breath sounds: Normal breath sounds and air entry.  ?Abdominal:  ?   Tenderness: There is no abdominal tenderness. There is no right CVA tenderness, left CVA tenderness, guarding or rebound. Negative signs include Murphy's sign, Rovsing's sign and McBurney's sign.  ?Musculoskeletal:  ?   Cervical back: Normal range of motion. No swelling, deformity, signs of trauma, rigidity, spasms, tenderness, bony tenderness or crepitus. No pain with movement.  ?   Thoracic back: Spasms and tenderness present. No swelling, deformity, signs of trauma or bony tenderness. Normal range of motion. No scoliosis.  ?   Lumbar back: No swelling, deformity, signs of trauma, spasms, tenderness or bony tenderness. Normal range of motion. Negative right straight leg raise test and negative left straight leg raise test. No scoliosis.  ?   Comments: TTP L thoracic paraspinous muscles. Pain elicited with flexion/extension and twisting side to side. No midline spinous tenderness, deformity, stepoff. Strength and sensation grossly intact upper and lower extremities.  ? ?Absolutely no other injury, deformity, tenderness, ecchymosis, abrasion.  ?Neurological:  ?   General: No focal deficit present.  ?   Mental Status: She is alert.  ?   Cranial Nerves: No cranial nerve deficit.  ?Psychiatric:     ?   Mood and Affect: Mood normal.     ?   Behavior: Behavior normal.     ?   Thought Content: Thought content normal.     ?   Judgment: Judgment normal.  ? ? ? ?UC Treatments / Results  ?Labs ?(all labs ordered are listed, but only abnormal results are displayed) ?Labs Reviewed  ?POC URINE PREG, ED  ?POCT URINALYSIS DIPSTICK, ED / UC  ? ? ?EKG ? ? ?Radiology ?No results found. ? ?Procedures ?Procedures (including critical care time) ? ?Medications Ordered in UC ?Medications   ?methylPREDNISolone sodium succinate (SOLU-MEDROL) 125 mg/2 mL injection 80 mg (has no administration in time range)  ? ? ?Initial  Impression / Assessment and Plan / UC Course  ?I have reviewed the triage vital signs and the nursing notes. ? ?Pertinent labs & imaging results that were available during my care of the patient were reviewed by me and considered in my medical decision making (see chart for details). ? ?  ? ?This patient is a very pleasant 46 y.o. year old female presenting with L thoracic strain. Afebrile, nontachycardic, no abd pain. ? ?UA wnl, did not send culture. ?U-preg negative.  ? ?IM solumedrol administered at patient request. Zanaflex, lidocaine patch. Also rec ibuprofen, heating pad.  ROM exercises.  ? ?Work note provided. ED return precautions discussed. Patient verbalizes understanding and agreement.  ? ? ?Final Clinical Impressions(s) / UC Diagnoses  ? ?Final diagnoses:  ?Thoracic myofascial strain, initial encounter  ? ? ? ?Discharge Instructions   ? ?  ?-Start the muscle relaxer-Zanaflex (tizanidine), up to 3 times daily for muscle spasms and pain.  This can make you drowsy, so take at bedtime or when you do not need to drive or operate machinery. ?-Lidocaine patch as needed for pain - do not use heating pad while using lidocaine patch  ?-You can take Tylenol up to 1000 mg 3 times daily, and ibuprofen up to 600 mg 3 times daily with food.  You can take these together, or alternate every 3-4 hours. ?-Heating pad, warm shower, gentle stretching  ? ? ? ?ED Prescriptions   ? ? Medication Sig Dispense Auth. Provider  ? tiZANidine (ZANAFLEX) 4 MG capsule Take 1 capsule (4 mg total) by mouth 3 (three) times daily. 21 capsule Rhys MartiniGraham, Elaura Calix E, PA-C  ? Lidocaine (HM LIDOCAINE PATCH) 4 % PTCH Apply 1 patch topically daily as needed. 12 patch Rhys MartiniGraham, Mattheus Rauls E, PA-C  ? ?  ? ?PDMP not reviewed this encounter. ?  ?Rhys MartiniGraham, Sandford Diop E, PA-C ?04/13/21 1135 ? ?

## 2021-04-14 LAB — POCT URINALYSIS DIPSTICK, ED / UC
Bilirubin Urine: NEGATIVE
Bilirubin Urine: NEGATIVE
Glucose, UA: NEGATIVE mg/dL
Glucose, UA: NEGATIVE mg/dL
Hgb urine dipstick: NEGATIVE
Hgb urine dipstick: NEGATIVE
Ketones, ur: NEGATIVE mg/dL
Ketones, ur: NEGATIVE mg/dL
Leukocytes,Ua: NEGATIVE
Leukocytes,Ua: NEGATIVE
Nitrite: NEGATIVE
Nitrite: NEGATIVE
Protein, ur: NEGATIVE mg/dL
Protein, ur: NEGATIVE mg/dL
Specific Gravity, Urine: 1.025 (ref 1.005–1.030)
Specific Gravity, Urine: 1.025 (ref 1.005–1.030)
Urobilinogen, UA: 0.2 mg/dL (ref 0.0–1.0)
Urobilinogen, UA: 0.2 mg/dL (ref 0.0–1.0)
pH: 5 (ref 5.0–8.0)
pH: 5 (ref 5.0–8.0)

## 2021-06-23 ENCOUNTER — Other Ambulatory Visit (HOSPITAL_COMMUNITY)
Admission: RE | Admit: 2021-06-23 | Discharge: 2021-06-23 | Disposition: A | Discharge status: Home / Self Care | Payer: 59 | Source: Ambulatory Visit | Attending: Obstetrics and Gynecology | Admitting: Obstetrics and Gynecology

## 2021-06-23 DIAGNOSIS — Z01419 Encounter for gynecological examination (general) (routine) without abnormal findings: Secondary | ICD-10-CM | POA: Insufficient documentation

## 2021-06-24 ENCOUNTER — Other Ambulatory Visit: Payer: Self-pay

## 2021-06-24 LAB — CYTOLOGY - PAP
Comment: NEGATIVE
Diagnosis: NEGATIVE
High risk HPV: NEGATIVE

## 2021-06-24 MED ORDER — GLUCOSE BLOOD VI STRP
ORAL_STRIP | 0 refills | Status: DC
  Filled 2021-06-24: qty 50, 50d supply, fill #0

## 2021-06-24 MED ORDER — OMEPRAZOLE 20 MG PO CPDR
DELAYED_RELEASE_CAPSULE | ORAL | 2 refills | Status: DC
  Filled 2021-06-24 – 2021-07-01 (×2): qty 30, 30d supply, fill #0

## 2021-06-24 MED ORDER — ONETOUCH VERIO REFLECT W/DEVICE KIT
PACK | 0 refills | Status: DC
  Filled 2021-06-24: qty 1, 30d supply, fill #0

## 2021-06-25 ENCOUNTER — Other Ambulatory Visit: Payer: Self-pay

## 2021-06-25 MED ORDER — ONETOUCH VERIO REFLECT W/DEVICE KIT: PACK | 0 refills | Status: DC

## 2021-06-25 MED ORDER — OMEPRAZOLE 20 MG PO CPDR
DELAYED_RELEASE_CAPSULE | ORAL | 2 refills | Status: DC
  Filled 2021-06-25: qty 30, 30d supply, fill #0

## 2021-06-25 MED ORDER — PHENTERMINE HCL 37.5 MG PO TABS
ORAL_TABLET | ORAL | 0 refills | Status: DC
  Filled 2021-06-25: qty 30, 30d supply, fill #0

## 2021-06-26 ENCOUNTER — Other Ambulatory Visit: Payer: Self-pay

## 2021-06-27 ENCOUNTER — Other Ambulatory Visit: Payer: Self-pay

## 2021-07-01 ENCOUNTER — Other Ambulatory Visit: Payer: Self-pay

## 2021-07-02 ENCOUNTER — Other Ambulatory Visit: Payer: Self-pay

## 2021-07-03 ENCOUNTER — Other Ambulatory Visit: Payer: Self-pay

## 2021-07-04 ENCOUNTER — Other Ambulatory Visit: Payer: Self-pay

## 2021-07-04 MED ORDER — ACCU-CHEK FASTCLIX LANCETS MISC: 0 refills | Status: DC

## 2021-07-07 ENCOUNTER — Other Ambulatory Visit: Payer: Self-pay

## 2021-07-07 MED ORDER — FLUCONAZOLE 150 MG PO TABS
ORAL_TABLET | ORAL | 0 refills | Status: DC
Start: 2021-07-07 — End: 2021-12-10
  Filled 2021-07-07: qty 1, 1d supply, fill #0

## 2021-07-08 ENCOUNTER — Other Ambulatory Visit: Payer: Self-pay

## 2021-07-09 ENCOUNTER — Other Ambulatory Visit: Payer: Self-pay

## 2021-07-10 ENCOUNTER — Other Ambulatory Visit: Payer: Self-pay

## 2021-07-11 ENCOUNTER — Other Ambulatory Visit: Payer: Self-pay

## 2021-07-14 ENCOUNTER — Other Ambulatory Visit: Payer: Self-pay

## 2021-07-15 ENCOUNTER — Other Ambulatory Visit: Payer: Self-pay

## 2021-07-17 ENCOUNTER — Other Ambulatory Visit: Payer: Self-pay

## 2021-07-17 MED ORDER — WEGOVY 0.25 MG/0.5ML ~~LOC~~ SOAJ: SUBCUTANEOUS | 0 refills | Status: DC

## 2021-07-22 ENCOUNTER — Other Ambulatory Visit: Payer: Self-pay

## 2021-07-22 MED ORDER — ONETOUCH DELICA PLUS LANCET33G MISC
0 refills | Status: DC
  Filled 2021-07-22: qty 100, 90d supply, fill #0

## 2021-07-28 ENCOUNTER — Other Ambulatory Visit: Payer: Self-pay

## 2021-08-01 ENCOUNTER — Other Ambulatory Visit: Payer: Self-pay

## 2021-08-26 DIAGNOSIS — R632 Polyphagia: Secondary | ICD-10-CM | POA: Diagnosis not present

## 2021-08-26 DIAGNOSIS — G4733 Obstructive sleep apnea (adult) (pediatric): Secondary | ICD-10-CM | POA: Diagnosis not present

## 2021-08-26 DIAGNOSIS — M25562 Pain in left knee: Secondary | ICD-10-CM | POA: Diagnosis not present

## 2021-08-26 DIAGNOSIS — R7303 Prediabetes: Secondary | ICD-10-CM | POA: Diagnosis not present

## 2021-09-01 NOTE — Progress Notes (Deleted)
   I, Philbert Riser, LAT, ATC acting as a scribe for [name], MD.  Subjective:    CC: Bilat knee and back pain  HPI: Pt is a 46 y/o female c/o bilat knee pain x /. Pt locates pain to /  Knee swelling: Mechanical symptoms: Radiates: Aggravates: Treatments tried:  Pt also c/o mid-back pain ongoing since early March. Pt was seen at the [facility] UC on 04/13/21 w/ this complaint and was given an IM solumedrol injection, and prescribed  Zanaflex, and lidocaine patches. Today, pt reports /. Pt locates pain to  Radiates: Aggravates: Treatments tried:  Pertinent review of Systems: ***  Relevant historical information: ***   Objective:   There were no vitals filed for this visit. General: Well Developed, well nourished, and in no acute distress.   MSK: ***  Lab and Radiology Results No results found for this or any previous visit (from the past 72 hour(s)). No results found.    Impression and Recommendations:    Assessment and Plan: 46 y.o. female with ***.  PDMP not reviewed this encounter. No orders of the defined types were placed in this encounter.  No orders of the defined types were placed in this encounter.   Discussed warning signs or symptoms. Please see discharge instructions. Patient expresses understanding.   ***

## 2021-09-02 ENCOUNTER — Ambulatory Visit: Payer: PRIVATE HEALTH INSURANCE | Admitting: Family Medicine

## 2021-09-08 NOTE — Progress Notes (Unsigned)
   I, Philbert Riser, LAT, ATC acting as a scribe for [name], MD.  Subjective:    CC: Bilat knee and back pain  HPI: Pt is a 46 y/o female c/o bilat knee pain x /. Pt locates pain to /  Knee swelling: Mechanical symptoms: Radiates: Aggravates: Treatments tried:  Pt also c/o mid-back pain ongoing since early March. Pt was seen at the [facility] on 04/13/21 w/ this complaint and was given an IM solumedrol injection, and prescribed  Zanaflex, and lidocaine patches. Today, pt reports /. Pt locates pain to  Radiates: Aggravates: Treatments tried:  Pertinent review of Systems: ***  Relevant historical information: ***   Objective:   There were no vitals filed for this visit. General: Well Developed, well nourished, and in no acute distress.   MSK: ***  Lab and Radiology Results No results found for this or any previous visit (from the past 72 hour(s)). No results found.    Impression and Recommendations:    Assessment and Plan: 46 y.o. female with ***.  PDMP not reviewed this encounter. No orders of the defined types were placed in this encounter.  No orders of the defined types were placed in this encounter.   Discussed warning signs or symptoms. Please see discharge instructions. Patient expresses understanding.   ***

## 2021-09-09 ENCOUNTER — Ambulatory Visit: Payer: Self-pay

## 2021-09-09 ENCOUNTER — Ambulatory Visit (INDEPENDENT_AMBULATORY_CARE_PROVIDER_SITE_OTHER): Payer: PRIVATE HEALTH INSURANCE | Admitting: Family Medicine

## 2021-09-09 ENCOUNTER — Other Ambulatory Visit: Payer: Self-pay

## 2021-09-09 VITALS — HR 105 | Ht 67.0 in | Wt 303.0 lb

## 2021-09-09 DIAGNOSIS — M25562 Pain in left knee: Secondary | ICD-10-CM

## 2021-09-09 DIAGNOSIS — Z3201 Encounter for pregnancy test, result positive: Secondary | ICD-10-CM | POA: Diagnosis not present

## 2021-09-09 DIAGNOSIS — G8929 Other chronic pain: Secondary | ICD-10-CM | POA: Diagnosis not present

## 2021-09-09 DIAGNOSIS — M545 Low back pain, unspecified: Secondary | ICD-10-CM

## 2021-09-09 DIAGNOSIS — M25561 Pain in right knee: Secondary | ICD-10-CM

## 2021-09-09 DIAGNOSIS — N911 Secondary amenorrhea: Secondary | ICD-10-CM | POA: Diagnosis not present

## 2021-09-09 MED ORDER — CITRANATAL 90 DHA 90-1 & 300 MG PO MISC
ORAL | 12 refills | Status: DC
  Filled 2021-09-11: qty 30, 30d supply, fill #0

## 2021-09-09 NOTE — Patient Instructions (Addendum)
Thank you for coming in today.   Plan for PT.   Recheck on Thursday. Anticipate xray and injection then.   Ask about Voltaren gel and ibuprofen for pain up until the 3rd trimester.

## 2021-09-10 NOTE — Progress Notes (Unsigned)
   I, Philbert Riser, LAT, ATC acting as a scribe for [name], MD.  Kimberly Joseph is a 46 y.o. female who presents to [facility] today for f/u bilat knee pain and LBP. Pt was last seen by Dr. Denyse Amass on 09/09/21 and since she had an OB check up later that day, was advised to consult OB regarding XR and knee steroid injections. Pt was also referred to PT. Today, pt reports   Pertinent review of systems: ***  Relevant historical information: ***   Exam:  There were no vitals taken for this visit. General: Well Developed, well nourished, and in no acute distress.   MSK: ***    Lab and Radiology Results No results found for this or any previous visit (from the past 72 hour(s)). No results found.     Assessment and Plan: 46 y.o. female with ***   PDMP not reviewed this encounter. No orders of the defined types were placed in this encounter.  No orders of the defined types were placed in this encounter.    Discussed warning signs or symptoms. Please see discharge instructions. Patient expresses understanding.   ***

## 2021-09-11 ENCOUNTER — Other Ambulatory Visit: Payer: Self-pay

## 2021-09-11 ENCOUNTER — Ambulatory Visit: Payer: PRIVATE HEALTH INSURANCE

## 2021-09-11 ENCOUNTER — Ambulatory Visit: Payer: Self-pay

## 2021-09-11 ENCOUNTER — Ambulatory Visit (INDEPENDENT_AMBULATORY_CARE_PROVIDER_SITE_OTHER): Payer: 59 | Admitting: Family Medicine

## 2021-09-11 VITALS — Ht 67.0 in | Wt 303.0 lb

## 2021-09-11 DIAGNOSIS — M25562 Pain in left knee: Secondary | ICD-10-CM

## 2021-09-11 DIAGNOSIS — G8929 Other chronic pain: Secondary | ICD-10-CM

## 2021-09-11 DIAGNOSIS — M25561 Pain in right knee: Secondary | ICD-10-CM | POA: Diagnosis not present

## 2021-09-11 DIAGNOSIS — N911 Secondary amenorrhea: Secondary | ICD-10-CM | POA: Diagnosis not present

## 2021-09-11 DIAGNOSIS — M545 Low back pain, unspecified: Secondary | ICD-10-CM | POA: Diagnosis not present

## 2021-09-11 NOTE — Patient Instructions (Addendum)
Good to see you  Tylenol and other medicines that were discussed  1 month follow up

## 2021-09-12 DIAGNOSIS — R8290 Unspecified abnormal findings in urine: Secondary | ICD-10-CM | POA: Diagnosis not present

## 2021-09-17 ENCOUNTER — Other Ambulatory Visit: Payer: Self-pay | Admitting: Nurse Practitioner

## 2021-09-18 ENCOUNTER — Ambulatory Visit: Payer: PRIVATE HEALTH INSURANCE | Admitting: Rehabilitative and Restorative Service Providers"

## 2021-09-23 DIAGNOSIS — O09291 Supervision of pregnancy with other poor reproductive or obstetric history, first trimester: Secondary | ICD-10-CM | POA: Diagnosis not present

## 2021-09-23 DIAGNOSIS — Z3480 Encounter for supervision of other normal pregnancy, unspecified trimester: Secondary | ICD-10-CM | POA: Diagnosis not present

## 2021-09-23 DIAGNOSIS — O99211 Obesity complicating pregnancy, first trimester: Secondary | ICD-10-CM | POA: Diagnosis not present

## 2021-09-23 DIAGNOSIS — O09521 Supervision of elderly multigravida, first trimester: Secondary | ICD-10-CM | POA: Diagnosis not present

## 2021-09-23 LAB — OB RESULTS CONSOLE HIV ANTIBODY (ROUTINE TESTING): HIV: NONREACTIVE

## 2021-09-23 LAB — OB RESULTS CONSOLE HEPATITIS B SURFACE ANTIGEN: Hepatitis B Surface Ag: NEGATIVE

## 2021-09-23 LAB — OB RESULTS CONSOLE ABO/RH: RH Type: POSITIVE

## 2021-09-23 LAB — OB RESULTS CONSOLE RUBELLA ANTIBODY, IGM: Rubella: IMMUNE

## 2021-09-23 LAB — HEPATITIS C ANTIBODY: HCV Ab: NEGATIVE

## 2021-09-23 LAB — OB RESULTS CONSOLE ANTIBODY SCREEN: Antibody Screen: NEGATIVE

## 2021-10-07 ENCOUNTER — Inpatient Hospital Stay (HOSPITAL_COMMUNITY): Payer: Commercial Managed Care - HMO

## 2021-10-07 ENCOUNTER — Inpatient Hospital Stay (HOSPITAL_COMMUNITY)
Admission: AD | Admit: 2021-10-07 | Discharge: 2021-10-07 | Disposition: A | Discharge status: Home / Self Care | Payer: Commercial Managed Care - HMO | Attending: Obstetrics and Gynecology | Admitting: Obstetrics and Gynecology

## 2021-10-07 ENCOUNTER — Encounter (HOSPITAL_COMMUNITY): Payer: Self-pay | Admitting: *Deleted

## 2021-10-07 ENCOUNTER — Other Ambulatory Visit: Payer: Self-pay

## 2021-10-07 DIAGNOSIS — K117 Disturbances of salivary secretion: Secondary | ICD-10-CM | POA: Diagnosis not present

## 2021-10-07 DIAGNOSIS — O26891 Other specified pregnancy related conditions, first trimester: Secondary | ICD-10-CM | POA: Diagnosis present

## 2021-10-07 DIAGNOSIS — Z3A09 9 weeks gestation of pregnancy: Secondary | ICD-10-CM | POA: Insufficient documentation

## 2021-10-07 DIAGNOSIS — O09521 Supervision of elderly multigravida, first trimester: Secondary | ICD-10-CM | POA: Diagnosis not present

## 2021-10-07 DIAGNOSIS — O210 Mild hyperemesis gravidarum: Secondary | ICD-10-CM | POA: Insufficient documentation

## 2021-10-07 DIAGNOSIS — O99611 Diseases of the digestive system complicating pregnancy, first trimester: Secondary | ICD-10-CM | POA: Insufficient documentation

## 2021-10-07 DIAGNOSIS — R109 Unspecified abdominal pain: Secondary | ICD-10-CM | POA: Insufficient documentation

## 2021-10-07 LAB — CBC
HCT: 34.8 % — ABNORMAL LOW (ref 36.0–46.0)
Hemoglobin: 11.6 g/dL — ABNORMAL LOW (ref 12.0–15.0)
MCH: 26 pg (ref 26.0–34.0)
MCHC: 33.3 g/dL (ref 30.0–36.0)
MCV: 77.9 fL — ABNORMAL LOW (ref 80.0–100.0)
Platelets: 322 10*3/uL (ref 150–400)
RBC: 4.47 MIL/uL (ref 3.87–5.11)
RDW: 14.3 % (ref 11.5–15.5)
WBC: 6.2 10*3/uL (ref 4.0–10.5)
nRBC: 0 % (ref 0.0–0.2)

## 2021-10-07 LAB — URINALYSIS, ROUTINE W REFLEX MICROSCOPIC
Bilirubin Urine: NEGATIVE
Glucose, UA: 150 mg/dL — AB
Hgb urine dipstick: NEGATIVE
Ketones, ur: NEGATIVE mg/dL
Leukocytes,Ua: NEGATIVE
Nitrite: NEGATIVE
Protein, ur: NEGATIVE mg/dL
Specific Gravity, Urine: 1.023 (ref 1.005–1.030)
pH: 6 (ref 5.0–8.0)

## 2021-10-07 LAB — POCT PREGNANCY, URINE: Preg Test, Ur: POSITIVE — AB

## 2021-10-07 LAB — HCG, QUANTITATIVE, PREGNANCY: hCG, Beta Chain, Quant, S: 93889 m[IU]/mL — ABNORMAL HIGH (ref <5)

## 2021-10-07 LAB — WET PREP, GENITAL
Sperm: NONE SEEN
Trich, Wet Prep: NONE SEEN
WBC, Wet Prep HPF POC: <10 (ref <10)
Yeast Wet Prep HPF POC: NONE SEEN

## 2021-10-07 LAB — HIV ANTIBODY (ROUTINE TESTING W REFLEX): HIV Screen 4th Generation wRfx: NONREACTIVE

## 2021-10-07 MED ORDER — GLYCOPYRROLATE 1 MG PO TABS: 1.0000 mg | ORAL_TABLET | Freq: Three times a day (TID) | ORAL | 0 refills | Status: DC

## 2021-10-07 MED ORDER — FAMOTIDINE 20 MG PO TABS
20.0000 mg | ORAL_TABLET | Freq: Once | ORAL | Status: AC
Start: 2021-10-07 — End: 2021-10-07
  Administered 2021-10-07: 20 mg via ORAL
  Filled 2021-10-07: qty 1

## 2021-10-07 MED ORDER — ONDANSETRON 4 MG PO TBDP
8.0000 mg | ORAL_TABLET | Freq: Once | ORAL | Status: AC
  Administered 2021-10-07: 8 mg via ORAL
  Filled 2021-10-07: qty 2

## 2021-10-07 MED ORDER — SCOPOLAMINE 1 MG/3DAYS TD PT72
1.0000 | MEDICATED_PATCH | Freq: Once | TRANSDERMAL | Status: DC
  Administered 2021-10-07: 1.5 mg via TRANSDERMAL
  Filled 2021-10-07: qty 1

## 2021-10-07 MED ORDER — PROMETHAZINE HCL 25 MG RE SUPP: 25.0000 mg | Freq: Four times a day (QID) | RECTAL | 0 refills | Status: DC | PRN

## 2021-10-07 MED ORDER — GLYCOPYRROLATE 1 MG PO TABS
1.0000 mg | ORAL_TABLET | Freq: Once | ORAL | Status: AC
Start: 2021-10-07 — End: 2021-10-07
  Administered 2021-10-07: 1 mg via ORAL
  Filled 2021-10-07: qty 1

## 2021-10-07 NOTE — MAU Note (Addendum)
Kimberly Joseph is a 46 y.o. at [redacted]w[redacted]d here in MAU reporting: she's has N/V, states not able to keep "whole lot" down.  Reports has noticed blood in her vomit for 1 week, but has increased. Also reports loss of appetite.  States having "very light" cramping.  Denies VB. LMP: 07/30/2021 Onset of complaint: 1 week Pain score: 6 Vitals:   10/07/21 1313  BP: (!) 145/86  Pulse: (!) 101  Resp: 20  Temp: 98 F (36.7 C)  SpO2: 97%     FHT:N/A Lab orders placed from triage:   UPT & UA

## 2021-10-07 NOTE — MAU Provider Note (Signed)
History     CSN: [id number]  Arrival date and time: 10/07/21 1254   Event Date/Time   First Provider Initiated Contact with Patient 10/07/21 1349      Chief Complaint  Patient presents with   Nausea   Emesis   HPI [name] is a 46 y.o. year old [phone] female at 60w6dweeks gestation who presents to MAU reporting N/V "for weeks" and unable to hold down "a whole lot" on a daily basis. She reports noticed blood in vomit x 1 week, but has increased in amount. She reports a loss of appetite. She also complains of "very light" cramping; rated 6/10. She denies VB or abnormal vaginal discharge. [facility]; next appt is 10/10/2021.   OB History     Gravida  4   Para  2   Term  1   Preterm  1   AB  1   Living  2      SAB  1   IAB      Ectopic      Multiple  0   Live Births  2           Past Medical History:  Diagnosis Date   Pregnancy induced hypertension     Past Surgical History:  Procedure Laterality Date   NO PAST SURGERIES      Family History  Problem Relation Age of Onset   Asthma Mother    Hypertension Mother    M32/ Stillbirths Mother    Hypertension Father    Vision loss Father    Stroke Maternal Grandfather     Social History   Tobacco Use   Smoking status: Never   Smokeless tobacco: Never  Substance Use Topics   Alcohol use: No   Drug use: No    Allergies: No Known Allergies  Medications Prior to Admission  Medication Sig Dispense Refill Last Dose   cetirizine (ZYRTEC) 10 MG tablet Take 10 mg by mouth daily as needed for allergies.   Past Month   Prenatal Vit w/Fe-Methylfol-FA (PNV PO) Take 1 tablet by mouth daily.    10/06/2021   Accu-Chek FastClix Lancets MISC use as directed to check blood sugar daily 102 each 0    Blood Glucose Monitoring Suppl (ONETOUCH VERIO REFLECT) w/Device KIT Use daily as needed. 1 kit 0    Blood Glucose Monitoring Suppl (ONETOUCH VERIO REFLECT) w/Device KIT Use  to check blood sugar daily as needed. 1 kit 0    fluconazole (DIFLUCAN) 150 MG tablet Take 1 tablet by mouth once 1 tablet 0    glucose blood test strip Use as directed to check blood sugar daily as needed. 50 each 0    ibuprofen (ADVIL,MOTRIN) 600 MG tablet Take 1 tablet (600 mg total) by mouth every 6 (six) hours. (Patient not taking: Reported on 10/07/2021) 30 tablet 0 Not Taking   Lancets (ONETOUCH DELICA PLUS [id number] MISC use as directed to test blood sugars once a day as needed 100 each 0    Lidocaine (HM LIDOCAINE PATCH) 4 % PTCH Apply 1 patch topically daily as needed. (Patient not taking: Reported on 10/07/2021) 12 patch 0 Not Taking   NIFEdipine (PROCARDIA-XL/ADALAT CC) 30 MG 24 hr tablet Take 1 tablet (30 mg total) by mouth daily. (Patient not taking: Reported on 10/07/2021) 30 tablet 6 Not Taking   omeprazole (PRILOSEC) 20 MG capsule Take 1 capsule by mouth once daily. 30 day(s) (Patient not taking: Reported on  10/07/2021) 30 capsule 2 Not Taking   omeprazole (PRILOSEC) 20 MG capsule Take 1 capsule by mouth once daily. (Patient not taking: Reported on 10/07/2021) 30 capsule 2 Not Taking   phentermine (ADIPEX-P) 37.5 MG tablet Take 0.5-1 tablets by mouth daily. (Patient not taking: Reported on 10/07/2021) 30 tablet 0 Not Taking   Prenat w/o A-FeCbGl-DSS-FA-DHA (CITRANATAL 90 DHA) 90-1 & 300 MG MISC Take 1 tablet by mouth daily 30 each 12    Semaglutide-Weight Management (WEGOVY) 0.25 MG/0.5ML SOAJ Inject 0.5 mL subcutaneously weekly (Patient not taking: Reported on 10/07/2021) 2 mL 0 Not Taking   Semaglutide-Weight Management (WEGOVY) 0.25 MG/0.5ML SOAJ Inject 0.28m (0.523m under the skin once weekly. 30 day(s) (Patient not taking: Reported on 10/07/2021) 2 mL 0 Not Taking   tiZANidine (ZANAFLEX) 4 MG capsule Take 1 capsule (4 mg total) by mouth 3 (three) times daily. (Patient not taking: Reported on 10/07/2021) 21 capsule 0 Not Taking    Review of Systems  Constitutional:  Positive for appetite  change.  HENT: Negative.    Eyes: Negative.   Respiratory: Negative.    Cardiovascular: Negative.   Gastrointestinal:  Positive for nausea and vomiting.  Endocrine: Negative.   Genitourinary:  Positive for pelvic pain (cramping).  Musculoskeletal: Negative.   Skin: Negative.   Allergic/Immunologic: Negative.   Neurological: Negative.   Hematological: Negative.   Psychiatric/Behavioral: Negative.     Physical Exam   Blood pressure 123/61, pulse 96, temperature 98 F (36.7 C), temperature source Oral, resp. rate 20, height 5' 6"  (1.676 m), weight (!) 167.9 kg, last menstrual period 07/30/2021, SpO2 97 %.  Physical Exam Vitals and nursing note reviewed. Exam conducted with a chaperone present.  Constitutional:      Appearance: Normal appearance. She is obese.  Pulmonary:     Effort: Pulmonary effort is normal.  Abdominal:     Palpations: Abdomen is soft.     Tenderness: There is abdominal tenderness (RLQ).  Genitourinary:    Comments: Wet prep and GC/CT collected with blind swab technique by patient Musculoskeletal:        General: Normal range of motion.  Skin:    General: Skin is warm and dry.  Neurological:     Mental Status: She is alert and oriented to person, place, and time.  Psychiatric:        Mood and Affect: Mood normal.        Behavior: Behavior normal.        Thought Content: Thought content normal.        Judgment: Judgment normal.    MAU Course  MDM  CCUA UPT CBC ABO/Rh -- known B Positive HCG Wet Prep GC/CT -- pending HIV -- pending OB < 14 wks [place] TV Zofran 8 mg ODT Pepcid 20 mg po Robinul 1 mg po Scopolamine patch PO Challenge -- patient tolerated well  Results for orders placed or performed during the hospital encounter of 10/07/21 (from the past 24 hour(s))  Urinalysis, Routine w reflex microscopic Urine, Clean Catch     Status: Abnormal   Collection Time: 10/07/21  1:02 PM  Result Value Ref Range   Color, Urine AMBER (A) YELLOW    APPearance HAZY (A) CLEAR   Specific Gravity, Urine 1.023 1.005 - 1.030   pH 6.0 5.0 - 8.0   Glucose, UA 150 (A) NEGATIVE mg/dL   Hgb urine dipstick NEGATIVE NEGATIVE   Bilirubin Urine NEGATIVE NEGATIVE   Ketones, ur NEGATIVE NEGATIVE mg/dL   Protein, ur NEGATIVE  NEGATIVE mg/dL   Nitrite NEGATIVE NEGATIVE   Leukocytes,Ua NEGATIVE NEGATIVE  Pregnancy, urine POC     Status: Abnormal   Collection Time: 10/07/21  1:03 PM  Result Value Ref Range   Preg Test, Ur POSITIVE (A) NEGATIVE  CBC     Status: Abnormal   Collection Time: 10/07/21  2:00 PM  Result Value Ref Range   WBC 6.2 4.0 - 10.5 K/uL   RBC 4.47 3.87 - 5.11 MIL/uL   Hemoglobin 11.6 (L) 12.0 - 15.0 g/dL   HCT 34.8 (L) 36.0 - 46.0 %   MCV 77.9 (L) 80.0 - 100.0 fL   MCH 26.0 26.0 - 34.0 pg   MCHC 33.3 30.0 - 36.0 g/dL   RDW 14.3 11.5 - 15.5 %   Platelets 322 150 - 400 K/uL   nRBC 0.0 0.0 - 0.2 %  hCG, quantitative, pregnancy     Status: Abnormal   Collection Time: 10/07/21  2:00 PM  Result Value Ref Range   hCG, Beta Chain, Quant, S 93,889 (H) <5 mIU/mL  HIV Antibody (routine testing w rflx)     Status: None   Collection Time: 10/07/21  2:00 PM  Result Value Ref Range   HIV Screen 4th Generation wRfx Non Reactive Non Reactive  Wet prep, genital     Status: Abnormal   Collection Time: 10/07/21  2:01 PM   Specimen: PATH Cytology Cervicovaginal Ancillary Only  Result Value Ref Range   Yeast Wet Prep HPF POC NONE SEEN NONE SEEN   Trich, Wet Prep NONE SEEN NONE SEEN   Clue Cells Wet Prep HPF POC PRESENT (A) NONE SEEN   WBC, Wet Prep HPF POC <10 <10   Sperm NONE SEEN     US OB LESS THAN 14 WEEKS WITH OB TRANSVAGINAL  Result Date: 10/07/2021 CLINICAL DATA:  46 year old female with abdominal pain. Estimated gestational age by last menstrual period equals 9 weeks 6 days. EXAM: OBSTETRIC <14 WK [place] AND TRANSVAGINAL OB US TECHNIQUE: Both transabdominal and transvaginal ultrasound examinations were performed for complete  evaluation of the gestation as well as the maternal uterus, adnexal regions, and pelvic cul-de-sac. Transvaginal technique was performed to assess early pregnancy. COMPARISON:  None Available. FINDINGS: Intrauterine gestational sac: Single Yolk sac:  Present Embryo:  Present Cardiac Activity: Present Heart Rate: 168 bpm CRL:  32.7 mm   10 w   1 d                  [place] EDC: 05/04/2022 Subchorionic hemorrhage:  None visualized. Maternal uterus/adnexae: Normal ovaries.  No free fluid. IMPRESSION: 1. Single intrauterine gestation with embryo and normal cardiac activity. 2. Estimated gestational age by crown rump length equals ten weeks 1 day. Electronically Signed   By: Suzy Bouchard M.D.   On: 10/07/2021 15:13     Assessment and Plan  1. Morning sickness - Information provided on morning sickness and bland diet - Rx for Phenergan 25 mg suppositories inserted vaginally every 6 hrs prn N/V   2. Ptyalism - Rx for Robinul 1 mg TID  3. Abdominal pain during pregnancy in first trimester - Information provided on abdominal pain in pregnancy   4. [redacted] weeks gestation of pregnancy   - Discharge patient - Keep scheduled appt with [facility] on Friday 10/10/2021 - Patient verbalized an understanding of the plan of care and agrees.   Laury Deep, CNM 10/07/2021, 1:50 PM

## 2021-10-08 LAB — GC/CHLAMYDIA PROBE AMP (~~LOC~~) NOT AT ARMC
Chlamydia: NEGATIVE
Comment: NEGATIVE
Comment: NORMAL
Neisseria Gonorrhea: NEGATIVE

## 2021-10-10 ENCOUNTER — Ambulatory Visit: Payer: Commercial Managed Care - HMO | Admitting: Rehabilitative and Restorative Service Providers"

## 2021-10-10 ENCOUNTER — Other Ambulatory Visit: Payer: Self-pay

## 2021-10-10 MED ORDER — METOCLOPRAMIDE HCL 10 MG PO TABS
10.0000 mg | ORAL_TABLET | Freq: Three times a day (TID) | ORAL | 1 refills | Status: DC
  Filled 2021-10-10: qty 60, 20d supply, fill #0

## 2021-10-10 MED ORDER — ONDANSETRON HCL 8 MG PO TABS
8.0000 mg | ORAL_TABLET | Freq: Three times a day (TID) | ORAL | 4 refills | Status: DC | PRN
  Filled 2021-10-10: qty 21, 7d supply, fill #0
  Filled 2021-12-30: qty 21, 7d supply, fill #1

## 2021-10-13 ENCOUNTER — Ambulatory Visit: Payer: PRIVATE HEALTH INSURANCE | Admitting: Family Medicine

## 2021-10-20 ENCOUNTER — Other Ambulatory Visit: Payer: PRIVATE HEALTH INSURANCE

## 2021-10-20 ENCOUNTER — Ambulatory Visit: Payer: PRIVATE HEALTH INSURANCE

## 2021-11-10 ENCOUNTER — Other Ambulatory Visit: Payer: Self-pay

## 2021-11-10 MED ORDER — PROMETHAZINE HCL 25 MG PO TABS
25.0000 mg | ORAL_TABLET | Freq: Four times a day (QID) | ORAL | 4 refills | Status: DC | PRN
  Filled 2021-11-10 – 2021-11-20 (×2): qty 120, 30d supply, fill #0

## 2021-11-10 MED ORDER — FAMOTIDINE 40 MG PO TABS
40.0000 mg | ORAL_TABLET | Freq: Every evening | ORAL | 6 refills | Status: DC
  Filled 2021-11-10 – 2021-11-20 (×2): qty 30, 30d supply, fill #0
  Filled 2021-12-30: qty 30, 30d supply, fill #1
  Filled 2022-01-29: qty 30, 30d supply, fill #2

## 2021-11-10 MED ORDER — SCOPOLAMINE 1 MG/3DAYS TD PT72
1.0000 | MEDICATED_PATCH | TRANSDERMAL | 4 refills | Status: DC | PRN
  Filled 2021-11-10: qty 10, 30d supply, fill #0

## 2021-11-10 MED ORDER — ONDANSETRON 8 MG PO TBDP
8.0000 mg | ORAL_TABLET | Freq: Three times a day (TID) | ORAL | 4 refills | Status: DC | PRN
  Filled 2021-11-10: qty 90, 30d supply, fill #0
  Filled 2021-12-31: qty 90, 30d supply, fill #1
  Filled 2022-01-29: qty 90, 30d supply, fill #2

## 2021-11-11 ENCOUNTER — Other Ambulatory Visit: Payer: Self-pay

## 2021-11-11 ENCOUNTER — Other Ambulatory Visit: Payer: Self-pay | Admitting: Obstetrics and Gynecology

## 2021-11-11 DIAGNOSIS — Z363 Encounter for antenatal screening for malformations: Secondary | ICD-10-CM

## 2021-11-11 DIAGNOSIS — O09522 Supervision of elderly multigravida, second trimester: Secondary | ICD-10-CM

## 2021-11-11 DIAGNOSIS — Z6841 Body Mass Index (BMI) 40.0 and over, adult: Secondary | ICD-10-CM

## 2021-11-17 ENCOUNTER — Other Ambulatory Visit: Payer: Self-pay

## 2021-11-20 ENCOUNTER — Other Ambulatory Visit: Payer: Self-pay

## 2021-12-10 ENCOUNTER — Ambulatory Visit: Payer: Commercial Managed Care - HMO | Attending: Pediatrics

## 2021-12-10 ENCOUNTER — Ambulatory Visit: Payer: Commercial Managed Care - HMO | Admitting: *Deleted

## 2021-12-10 ENCOUNTER — Ambulatory Visit (HOSPITAL_BASED_OUTPATIENT_CLINIC_OR_DEPARTMENT_OTHER): Payer: Commercial Managed Care - HMO | Admitting: Obstetrics

## 2021-12-10 VITALS — BP 129/66 | HR 105

## 2021-12-10 DIAGNOSIS — Z6841 Body Mass Index (BMI) 40.0 and over, adult: Secondary | ICD-10-CM | POA: Diagnosis present

## 2021-12-10 DIAGNOSIS — Z363 Encounter for antenatal screening for malformations: Secondary | ICD-10-CM | POA: Diagnosis not present

## 2021-12-10 DIAGNOSIS — E669 Obesity, unspecified: Secondary | ICD-10-CM

## 2021-12-10 DIAGNOSIS — O09522 Supervision of elderly multigravida, second trimester: Secondary | ICD-10-CM | POA: Insufficient documentation

## 2021-12-10 DIAGNOSIS — Z3A19 19 weeks gestation of pregnancy: Secondary | ICD-10-CM

## 2021-12-10 DIAGNOSIS — O99212 Obesity complicating pregnancy, second trimester: Secondary | ICD-10-CM | POA: Insufficient documentation

## 2021-12-10 DIAGNOSIS — O09212 Supervision of pregnancy with history of pre-term labor, second trimester: Secondary | ICD-10-CM

## 2021-12-10 NOTE — Progress Notes (Signed)
MFM Note  Kimberly Joseph was seen for a detailed fetal anatomy scan due to maternal obesity with a BMI of 60 and advanced maternal age (46 years old).  Her prior pregnancy was complicated by gestational diabetes.   She denies any problems in her current pregnancy.    She had a cell free DNA test earlier in her pregnancy which indicated a low risk for trisomy 26, 56, and 13. A female fetus is predicted.   She was informed that the fetal growth and amniotic fluid level were appropriate for her gestational age.   There were no obvious fetal anomalies noted on today's ultrasound exam.  However, the views of the fetal anatomy were limited today due to the fetal position and maternal body habitus.  The patient was informed that anomalies may be missed due to technical limitations. If the fetus is in a suboptimal position or maternal habitus is increased, visualization of the fetus in the maternal uterus may be impaired.  The following were discussed during today's consultation:  Advanced maternal age in pregnancy  The increased risk of fetal aneuploidy due to advanced maternal age was discussed.   Due to advanced maternal age, the patient was offered and declined an amniocentesis today for definitive diagnosis of fetal aneuploidy.  She is comfortable with her negative cell free DNA test.  Obesity in pregnancy  The recommended total weight gain in pregnancy for obese women is between 10 to 20 pounds.  Due to obesity and her history of gestational diabetes in her prior pregnancy, an early screen for gestational diabetes is recommended.  If the initial early diabetes screening result is negative, a repeat diabetes screen is recommended again at 24 to 28 weeks.  As maternal obesity may present challenges associated with the management of anesthesia, an anesthesia consult should be obtained when she is admitted in labor.  The increased risk of preeclampsia due to advanced maternal age and  obesity was discussed.  She was advised to start taking a daily baby aspirin (81 mg each) to decrease her risk of preeclampsia.  She should continue the daily baby aspirin for the duration of her pregnancy.  We will continue to follow her with monthly growth ultrasounds.    Due to advanced maternal age and maternal obesity, weekly fetal testing using either NSTs or BPP's should be started at around 32 weeks.    As she is over the age of 83, delivery should occur by 39 weeks.  The patient stated that all of her questions have been answered to her satisfaction.    A follow-up exam was scheduled in 4 weeks to complete the views of the fetal anatomy and to assess the fetal growth.   A total of 30 minutes was spent counseling and coordinating the care for this patient.  Greater than 50% of the time was spent in direct face-to-face contact.

## 2021-12-11 ENCOUNTER — Other Ambulatory Visit: Payer: Self-pay | Admitting: *Deleted

## 2021-12-11 DIAGNOSIS — O09299 Supervision of pregnancy with other poor reproductive or obstetric history, unspecified trimester: Secondary | ICD-10-CM

## 2021-12-11 DIAGNOSIS — Z6841 Body Mass Index (BMI) 40.0 and over, adult: Secondary | ICD-10-CM | POA: Diagnosis not present

## 2021-12-11 DIAGNOSIS — O99212 Obesity complicating pregnancy, second trimester: Secondary | ICD-10-CM | POA: Diagnosis not present

## 2021-12-11 DIAGNOSIS — O09899 Supervision of other high risk pregnancies, unspecified trimester: Secondary | ICD-10-CM

## 2021-12-11 DIAGNOSIS — O09522 Supervision of elderly multigravida, second trimester: Secondary | ICD-10-CM

## 2021-12-11 DIAGNOSIS — R638 Other symptoms and signs concerning food and fluid intake: Secondary | ICD-10-CM

## 2021-12-11 DIAGNOSIS — Z3490 Encounter for supervision of normal pregnancy, unspecified, unspecified trimester: Secondary | ICD-10-CM | POA: Diagnosis not present

## 2021-12-11 DIAGNOSIS — Z8632 Personal history of gestational diabetes: Secondary | ICD-10-CM

## 2021-12-16 ENCOUNTER — Other Ambulatory Visit: Payer: Self-pay

## 2021-12-17 DIAGNOSIS — R7303 Prediabetes: Secondary | ICD-10-CM | POA: Diagnosis not present

## 2021-12-30 ENCOUNTER — Other Ambulatory Visit: Payer: Self-pay

## 2021-12-31 ENCOUNTER — Other Ambulatory Visit: Payer: Self-pay

## 2022-01-01 ENCOUNTER — Other Ambulatory Visit: Payer: Self-pay

## 2022-01-02 ENCOUNTER — Other Ambulatory Visit: Payer: Self-pay

## 2022-01-08 ENCOUNTER — Ambulatory Visit: Payer: Commercial Managed Care - HMO | Admitting: *Deleted

## 2022-01-08 ENCOUNTER — Ambulatory Visit: Payer: Commercial Managed Care - HMO | Attending: Obstetrics

## 2022-01-08 ENCOUNTER — Other Ambulatory Visit: Payer: Self-pay | Admitting: *Deleted

## 2022-01-08 VITALS — BP 134/75 | HR 94

## 2022-01-08 DIAGNOSIS — Z3A23 23 weeks gestation of pregnancy: Secondary | ICD-10-CM | POA: Diagnosis not present

## 2022-01-08 DIAGNOSIS — O09212 Supervision of pregnancy with history of pre-term labor, second trimester: Secondary | ICD-10-CM

## 2022-01-08 DIAGNOSIS — O09522 Supervision of elderly multigravida, second trimester: Secondary | ICD-10-CM | POA: Diagnosis not present

## 2022-01-08 DIAGNOSIS — O09299 Supervision of pregnancy with other poor reproductive or obstetric history, unspecified trimester: Secondary | ICD-10-CM | POA: Diagnosis present

## 2022-01-08 DIAGNOSIS — O99212 Obesity complicating pregnancy, second trimester: Secondary | ICD-10-CM

## 2022-01-08 DIAGNOSIS — Z8632 Personal history of gestational diabetes: Secondary | ICD-10-CM | POA: Diagnosis present

## 2022-01-08 DIAGNOSIS — O132 Gestational [pregnancy-induced] hypertension without significant proteinuria, second trimester: Secondary | ICD-10-CM

## 2022-01-08 DIAGNOSIS — R638 Other symptoms and signs concerning food and fluid intake: Secondary | ICD-10-CM | POA: Diagnosis present

## 2022-01-08 DIAGNOSIS — O09899 Supervision of other high risk pregnancies, unspecified trimester: Secondary | ICD-10-CM | POA: Diagnosis present

## 2022-01-08 DIAGNOSIS — E669 Obesity, unspecified: Secondary | ICD-10-CM | POA: Diagnosis not present

## 2022-01-08 DIAGNOSIS — O09292 Supervision of pregnancy with other poor reproductive or obstetric history, second trimester: Secondary | ICD-10-CM | POA: Diagnosis not present

## 2022-01-29 ENCOUNTER — Other Ambulatory Visit: Payer: Self-pay

## 2022-01-30 ENCOUNTER — Other Ambulatory Visit: Payer: Self-pay

## 2022-02-02 NOTE — L&D Delivery Note (Addendum)
Delivery Note Arrived to room to evaluate for cervical change. Patient was found to be 9cm and at 0 station with a bulging bag. Patient feeling urge to push. Bed was broken down and pushing initiated as patient became completely dilated. Forebag spontaneously ruptured - light meconium noted. Fetal head was delivered. No nuchal was present. Compound presentation was noted and patient was ROA. The anterior shoulder did not deliver easily and a shoulder dystocia called. McRobert's maneuver performed and anterior shoulder delivered without incident. The remainder of the body followed. Shoulder dystocia lasted 20 seconds. Infant was placed on mother's abdomen, mouth and nose were bulb suctioned and let out a vigorous cry. Delayed cord clamping was performed for 60 seconds. Cord clamped and cut. Venous cord blood was collected. Placenta delivered spontaneously. Cervix, vagina, perineum and labia were inspected and a right periurethral and second degree perineal laceration was noted. The right periurethral was repaired using 4-0 Vicryl after administration of local anesthetic. The second degree laceration as repaired using 3-0 Vicryl after administration of local anesthetic. Uterus fundus firm. TXA 1g IV was given as prophylaxis. Placenta was inspected, found to be intact with a 3 vessel cord. Counts were correct.  At 12:53 PM a viable and healthy female was delivered via Vaginal, Spontaneous (Presentation: Right Occiput Anterior).  APGAR: 8, 9; weight: pending. Placenta status: Spontaneous;Pathology, Intact.  Cord: 3 vessels with the following complications: None.  Cord pH: Not collected  Anesthesia: Local Episiotomy: None Lacerations: 2nd degree Suture Repair: 3.0 vicryl and 4.0 Vicryl Est. Blood Loss (mL):  153  Mom to postpartum.  Baby to Couplet care / Skin to Skin.  Kimberly Joseph 04/21/2022, 1:13 PM

## 2022-02-03 ENCOUNTER — Other Ambulatory Visit: Payer: Self-pay

## 2022-02-10 ENCOUNTER — Other Ambulatory Visit: Payer: Self-pay

## 2022-02-12 ENCOUNTER — Ambulatory Visit: Payer: Medicaid Other | Attending: Obstetrics and Gynecology

## 2022-02-12 ENCOUNTER — Ambulatory Visit: Payer: Medicaid Other | Admitting: *Deleted

## 2022-02-12 ENCOUNTER — Other Ambulatory Visit: Payer: Self-pay | Admitting: *Deleted

## 2022-02-12 VITALS — BP 103/68 | HR 117

## 2022-02-12 DIAGNOSIS — O132 Gestational [pregnancy-induced] hypertension without significant proteinuria, second trimester: Secondary | ICD-10-CM | POA: Diagnosis not present

## 2022-02-12 DIAGNOSIS — O99213 Obesity complicating pregnancy, third trimester: Secondary | ICD-10-CM | POA: Diagnosis not present

## 2022-02-12 DIAGNOSIS — Z8632 Personal history of gestational diabetes: Secondary | ICD-10-CM

## 2022-02-12 DIAGNOSIS — O133 Gestational [pregnancy-induced] hypertension without significant proteinuria, third trimester: Secondary | ICD-10-CM

## 2022-02-12 DIAGNOSIS — E669 Obesity, unspecified: Secondary | ICD-10-CM

## 2022-02-12 DIAGNOSIS — O09522 Supervision of elderly multigravida, second trimester: Secondary | ICD-10-CM | POA: Diagnosis not present

## 2022-02-12 DIAGNOSIS — O99212 Obesity complicating pregnancy, second trimester: Secondary | ICD-10-CM | POA: Diagnosis not present

## 2022-02-12 DIAGNOSIS — O09292 Supervision of pregnancy with other poor reproductive or obstetric history, second trimester: Secondary | ICD-10-CM | POA: Diagnosis not present

## 2022-02-12 DIAGNOSIS — O09213 Supervision of pregnancy with history of pre-term labor, third trimester: Secondary | ICD-10-CM

## 2022-02-12 DIAGNOSIS — O09523 Supervision of elderly multigravida, third trimester: Secondary | ICD-10-CM

## 2022-02-12 DIAGNOSIS — Z3A28 28 weeks gestation of pregnancy: Secondary | ICD-10-CM | POA: Diagnosis not present

## 2022-02-12 DIAGNOSIS — O09293 Supervision of pregnancy with other poor reproductive or obstetric history, third trimester: Secondary | ICD-10-CM | POA: Diagnosis not present

## 2022-02-13 DIAGNOSIS — Z3483 Encounter for supervision of other normal pregnancy, third trimester: Secondary | ICD-10-CM | POA: Diagnosis not present

## 2022-02-13 DIAGNOSIS — Z3490 Encounter for supervision of normal pregnancy, unspecified, unspecified trimester: Secondary | ICD-10-CM | POA: Diagnosis not present

## 2022-02-13 DIAGNOSIS — Z23 Encounter for immunization: Secondary | ICD-10-CM | POA: Diagnosis not present

## 2022-02-13 DIAGNOSIS — O99013 Anemia complicating pregnancy, third trimester: Secondary | ICD-10-CM | POA: Diagnosis not present

## 2022-02-17 DIAGNOSIS — R3989 Other symptoms and signs involving the genitourinary system: Secondary | ICD-10-CM | POA: Diagnosis not present

## 2022-03-10 ENCOUNTER — Telehealth: Payer: Self-pay | Admitting: Hematology and Oncology

## 2022-03-10 NOTE — Telephone Encounter (Signed)
Scheduled appt per 2/6 referral. Pt is aware of appt date and time. Pt is aware to arrive 15 mins prior to appt time and to bring and updated insurance card. Pt is aware of appt location.

## 2022-03-12 ENCOUNTER — Other Ambulatory Visit: Payer: Self-pay | Admitting: *Deleted

## 2022-03-12 ENCOUNTER — Other Ambulatory Visit: Payer: Self-pay | Admitting: Maternal & Fetal Medicine

## 2022-03-12 ENCOUNTER — Ambulatory Visit: Payer: Medicaid Other

## 2022-03-12 ENCOUNTER — Ambulatory Visit: Payer: Medicaid Other | Attending: Maternal & Fetal Medicine

## 2022-03-12 VITALS — BP 125/74 | HR 101

## 2022-03-12 DIAGNOSIS — O99213 Obesity complicating pregnancy, third trimester: Secondary | ICD-10-CM | POA: Diagnosis not present

## 2022-03-12 DIAGNOSIS — O09893 Supervision of other high risk pregnancies, third trimester: Secondary | ICD-10-CM

## 2022-03-12 DIAGNOSIS — O09293 Supervision of pregnancy with other poor reproductive or obstetric history, third trimester: Secondary | ICD-10-CM

## 2022-03-12 DIAGNOSIS — Z8632 Personal history of gestational diabetes: Secondary | ICD-10-CM | POA: Diagnosis not present

## 2022-03-12 DIAGNOSIS — O133 Gestational [pregnancy-induced] hypertension without significant proteinuria, third trimester: Secondary | ICD-10-CM

## 2022-03-12 DIAGNOSIS — Z3A32 32 weeks gestation of pregnancy: Secondary | ICD-10-CM | POA: Diagnosis not present

## 2022-03-12 DIAGNOSIS — O09213 Supervision of pregnancy with history of pre-term labor, third trimester: Secondary | ICD-10-CM

## 2022-03-12 DIAGNOSIS — O09523 Supervision of elderly multigravida, third trimester: Secondary | ICD-10-CM

## 2022-03-12 DIAGNOSIS — E669 Obesity, unspecified: Secondary | ICD-10-CM

## 2022-03-13 ENCOUNTER — Telehealth: Payer: Self-pay | Admitting: Hematology and Oncology

## 2022-03-13 NOTE — Telephone Encounter (Signed)
R/s pt's new hem appt to an earlier time. Pt is aware.

## 2022-03-18 ENCOUNTER — Ambulatory Visit: Payer: Medicaid Other | Admitting: *Deleted

## 2022-03-18 ENCOUNTER — Other Ambulatory Visit: Payer: Self-pay | Admitting: Maternal & Fetal Medicine

## 2022-03-18 ENCOUNTER — Ambulatory Visit: Payer: Medicaid Other | Attending: Maternal & Fetal Medicine

## 2022-03-18 ENCOUNTER — Encounter: Payer: Self-pay | Admitting: *Deleted

## 2022-03-18 DIAGNOSIS — O09213 Supervision of pregnancy with history of pre-term labor, third trimester: Secondary | ICD-10-CM | POA: Diagnosis not present

## 2022-03-18 DIAGNOSIS — Z3A33 33 weeks gestation of pregnancy: Secondary | ICD-10-CM | POA: Diagnosis not present

## 2022-03-18 DIAGNOSIS — O283 Abnormal ultrasonic finding on antenatal screening of mother: Secondary | ICD-10-CM | POA: Insufficient documentation

## 2022-03-18 DIAGNOSIS — O133 Gestational [pregnancy-induced] hypertension without significant proteinuria, third trimester: Secondary | ICD-10-CM | POA: Diagnosis not present

## 2022-03-18 DIAGNOSIS — O09523 Supervision of elderly multigravida, third trimester: Secondary | ICD-10-CM | POA: Diagnosis not present

## 2022-03-18 DIAGNOSIS — O09893 Supervision of other high risk pregnancies, third trimester: Secondary | ICD-10-CM | POA: Diagnosis not present

## 2022-03-18 DIAGNOSIS — Z8632 Personal history of gestational diabetes: Secondary | ICD-10-CM

## 2022-03-18 DIAGNOSIS — O99213 Obesity complicating pregnancy, third trimester: Secondary | ICD-10-CM

## 2022-03-18 DIAGNOSIS — E669 Obesity, unspecified: Secondary | ICD-10-CM

## 2022-03-18 DIAGNOSIS — O09293 Supervision of pregnancy with other poor reproductive or obstetric history, third trimester: Secondary | ICD-10-CM

## 2022-03-18 NOTE — Procedures (Signed)
Kimberly Joseph August 30, 1975 [redacted]w[redacted]d Fetus A Non-Stress Test Interpretation for 03/18/22  Indication: Unsatisfactory BPP  Fetal Heart Rate A Mode: External Baseline Rate (A): 145 bpm Variability: Moderate Accelerations: 15 x 15 Decelerations: None Multiple birth?: No  Uterine Activity Mode: Palpation, Toco Contraction Frequency (min): none Resting Tone Palpated: Relaxed  Interpretation (Fetal Testing) Nonstress Test Interpretation: Reactive Overall Impression: Reassuring for gestational age Comments: Dr. SDonalee Citrinreviewed tracing

## 2022-03-19 ENCOUNTER — Inpatient Hospital Stay (HOSPITAL_BASED_OUTPATIENT_CLINIC_OR_DEPARTMENT_OTHER): Payer: Medicaid Other | Admitting: Hematology and Oncology

## 2022-03-19 ENCOUNTER — Inpatient Hospital Stay: Payer: Medicaid Other | Attending: Hematology and Oncology | Admitting: Hematology and Oncology

## 2022-03-19 ENCOUNTER — Other Ambulatory Visit: Payer: Self-pay

## 2022-03-19 ENCOUNTER — Inpatient Hospital Stay: Payer: PRIVATE HEALTH INSURANCE | Admitting: Hematology and Oncology

## 2022-03-19 ENCOUNTER — Inpatient Hospital Stay: Payer: PRIVATE HEALTH INSURANCE

## 2022-03-19 VITALS — BP 130/70 | HR 118 | Temp 97.9°F | Resp 18 | Ht 66.0 in | Wt 352.5 lb

## 2022-03-19 DIAGNOSIS — R0602 Shortness of breath: Secondary | ICD-10-CM | POA: Diagnosis not present

## 2022-03-19 DIAGNOSIS — Z8249 Family history of ischemic heart disease and other diseases of the circulatory system: Secondary | ICD-10-CM | POA: Insufficient documentation

## 2022-03-19 DIAGNOSIS — D509 Iron deficiency anemia, unspecified: Secondary | ICD-10-CM

## 2022-03-19 DIAGNOSIS — Z821 Family history of blindness and visual loss: Secondary | ICD-10-CM | POA: Diagnosis not present

## 2022-03-19 DIAGNOSIS — Z825 Family history of asthma and other chronic lower respiratory diseases: Secondary | ICD-10-CM | POA: Diagnosis not present

## 2022-03-19 DIAGNOSIS — Z823 Family history of stroke: Secondary | ICD-10-CM

## 2022-03-19 DIAGNOSIS — Z79899 Other long term (current) drug therapy: Secondary | ICD-10-CM | POA: Diagnosis not present

## 2022-03-19 DIAGNOSIS — R42 Dizziness and giddiness: Secondary | ICD-10-CM | POA: Insufficient documentation

## 2022-03-19 DIAGNOSIS — O26893 Other specified pregnancy related conditions, third trimester: Secondary | ICD-10-CM

## 2022-03-19 DIAGNOSIS — K219 Gastro-esophageal reflux disease without esophagitis: Secondary | ICD-10-CM | POA: Diagnosis not present

## 2022-03-19 DIAGNOSIS — Z3A37 37 weeks gestation of pregnancy: Secondary | ICD-10-CM | POA: Diagnosis not present

## 2022-03-19 DIAGNOSIS — O99013 Anemia complicating pregnancy, third trimester: Secondary | ICD-10-CM | POA: Diagnosis not present

## 2022-03-19 DIAGNOSIS — Z809 Family history of malignant neoplasm, unspecified: Secondary | ICD-10-CM

## 2022-03-19 DIAGNOSIS — O99613 Diseases of the digestive system complicating pregnancy, third trimester: Secondary | ICD-10-CM

## 2022-03-19 LAB — CBC WITH DIFFERENTIAL/PLATELET
Abs Immature Granulocytes: 0.02 10*3/uL (ref 0.00–0.07)
Basophils Absolute: 0.1 10*3/uL (ref 0.0–0.1)
Basophils Relative: 1 %
Eosinophils Absolute: 0 10*3/uL (ref 0.0–0.5)
Eosinophils Relative: 1 %
HCT: 34.5 % — ABNORMAL LOW (ref 36.0–46.0)
Hemoglobin: 11.4 g/dL — ABNORMAL LOW (ref 12.0–15.0)
Immature Granulocytes: 0 %
Lymphocytes Relative: 25 %
Lymphs Abs: 1.7 10*3/uL (ref 0.7–4.0)
MCH: 25.5 pg — ABNORMAL LOW (ref 26.0–34.0)
MCHC: 33 g/dL (ref 30.0–36.0)
MCV: 77.2 fL — ABNORMAL LOW (ref 80.0–100.0)
Monocytes Absolute: 0.6 10*3/uL (ref 0.1–1.0)
Monocytes Relative: 8 %
Neutro Abs: 4.4 10*3/uL (ref 1.7–7.7)
Neutrophils Relative %: 65 %
Platelets: 273 10*3/uL (ref 150–400)
RBC: 4.47 MIL/uL (ref 3.87–5.11)
RDW: 16.8 % — ABNORMAL HIGH (ref 11.5–15.5)
WBC: 6.7 10*3/uL (ref 4.0–10.5)
nRBC: 0 % (ref 0.0–0.2)

## 2022-03-19 NOTE — Progress Notes (Signed)
[facility] NOTE  Patient Care Team: Christophe Louis, MD as PCP - General (Obstetrics and Gynecology) Drema Dallas, DO as PCP - OBGYN (Obstetrics and Gynecology)  CHIEF COMPLAINTS/PURPOSE OF CONSULTATION:  IDA  ASSESSMENT & PLAN:   This is a very pleasant 47 year old female patient who is currently [redacted] weeks pregnant, Jehovah's Witness referred to hematology for consideration of iron infusion given iron deficiency anemia.  We had received some labs which showed a ferritin of 27 and an iron panel suggestive of iron deficiency but we do not have a most recent CBC looking at her hemoglobin to define the severity of her anemia.  She tells me that she has been feeling more fatigued, short of breath on exertion and dizzy with this pregnancy.  She has never had to take any oral iron or iron infusion during her previous pregnancies.  No bleeding issues.  She says the pregnancy is otherwise going well.  No concerns on physical exam, obesity noted, gravid uterus noted.  I have recommended CBC today to first noticed the severity of anemia.   I have also discussed with her about iron infusion which is generally very well-tolerated although there is a small risk of severe allergic/anaphylactic reaction in about 1% of the patients.  We have also discussed that there are no large randomized control trials studies demonstrating safety in pregnant women but it is generally considered to be safe.  She was not very keen on proceeding with IV iron after the above-mentioned information.  She wanted to wait to see what her hemoglobin is and she would rather continue oral iron.  I will call her with the results and discuss any additional recommendations.  Thank you for consulting [place] in the care of this patient.  Please do not hesitate to contact us with any additional questions or concerns.   HISTORY OF PRESENTING ILLNESS:  Kimberly Joseph 47 y.o. female is here because of IDA  This is a very  pleasant 47 year old female patient with currently at 58 to [redacted] weeks pregnant and an Jehovah witness referred to hematology for evaluation of iron deficiency anemia.  She had some labs back in January which showed ferritin of 27, I do not see a hemoglobin that has been scanned into our system.  She tells me that she feels much more fatigued than her prior pregnancies.  She also feels some dizziness which is not necessarily positional and some shortness of breath on exertion.  She also has some underlying GERD from a pregnancy and has not been eating very well.  Her last pregnancy was about 4 years ago.  She will not be receiving any blood products because of her Jehovah's Witness status but is open to the idea of iron infusions.  She is currently taking oral iron once to twice a day.  She says everything is going very well with the pregnancy so far otherwise.  Rest of the pertinent 10 point ROS reviewed and negative   MEDICAL HISTORY:  Past Medical History:  Diagnosis Date   Pregnancy induced hypertension     SURGICAL HISTORY: Past Surgical History:  Procedure Laterality Date   NO PAST SURGERIES      SOCIAL HISTORY: Social History   Socioeconomic History   Marital status: Married    Spouse name: Not on file   Number of children: Not on file   Years of education: Not on file   Highest education level: Not on file  Occupational History   Not  on file  Tobacco Use   Smoking status: Never   Smokeless tobacco: Never  Vaping Use   Vaping Use: Never used  Substance and Sexual Activity   Alcohol use: No   Drug use: No   Sexual activity: Yes    Birth control/protection: None  Other Topics Concern   Not on file  Social History Narrative   Not on file   Social Determinants of Health   Financial Resource Strain: Not on file  Food Insecurity: Not on file  Transportation Needs: Not on file  Physical Activity: Not on file  Stress: Not on file  Social Connections: Not on file   Intimate Partner Violence: Not on file    FAMILY HISTORY: Family History  Problem Relation Age of Onset   Asthma Mother    Hypertension Mother    Miscarriages / [place] Mother    Cancer Father    Hypertension Father    Vision loss Father    Stroke Maternal Grandfather    Heart disease Neg Hx    Diabetes Neg Hx     ALLERGIES:  has No Known Allergies.  MEDICATIONS:  Current Outpatient Medications  Medication Sig Dispense Refill   Accu-Chek FastClix Lancets MISC use as directed to check blood sugar daily (Patient not taking: Reported on 12/10/2021) 102 each 0   Blood Glucose Monitoring Suppl (ONETOUCH VERIO REFLECT) w/Device KIT Use daily as needed. (Patient not taking: Reported on 12/10/2021) 1 kit 0   Blood Glucose Monitoring Suppl (ONETOUCH VERIO REFLECT) w/Device KIT Use to check blood sugar daily as needed. (Patient not taking: Reported on 12/10/2021) 1 kit 0   famotidine (PEPCID) 40 MG tablet Take 1 tablet (40 mg total) by mouth at bedtime. 30 tablet 6   glucose blood test strip Use as directed to check blood sugar daily as needed. (Patient not taking: Reported on 01/08/2022) 50 each 0   glycopyrrolate (ROBINUL) 1 MG tablet Take 1 tablet (1 mg total) by mouth 3 (three) times daily. (Patient not taking: Reported on 01/08/2022) 90 tablet 0   Lancets (ONETOUCH DELICA PLUS 123XX123) MISC use as directed to test blood sugars once a day as needed (Patient not taking: Reported on 12/10/2021) 100 each 0   metoCLOPramide (REGLAN) 10 MG tablet Take 1 tablet (10 mg total) by mouth 3 (three) times daily before meals. (Patient not taking: Reported on 01/08/2022) 60 tablet 1   omeprazole (PRILOSEC) 20 MG capsule Take 1 capsule by mouth once daily. 30 day(s) (Patient not taking: Reported on 03/12/2022) 30 capsule 2   omeprazole (PRILOSEC) 20 MG capsule Take 1 capsule by mouth once daily. (Patient not taking: Reported on 10/07/2021) 30 capsule 2   ondansetron (ZOFRAN-ODT) 8 MG disintegrating tablet  Dissolve 1 tablet (8 mg total) by mouth every 8 (eight) hours as needed. 90 tablet 4   Prenat w/o A-FeCbGl-DSS-FA-DHA (CITRANATAL 90 DHA) 90-1 & 300 MG MISC Take 1 tablet by mouth daily (Patient not taking: Reported on 12/10/2021) 30 each 12   Prenatal Vit w/Fe-Methylfol-FA (PNV PO) Take 1 tablet by mouth daily.      promethazine (PHENERGAN) 25 MG tablet Take 1 tablet (25 mg total) by mouth every 6 (six) hours as needed. (Patient not taking: Reported on 12/10/2021) 120 tablet 4   Semaglutide-Weight Management (WEGOVY) 0.25 MG/0.5ML SOAJ Inject 0.8m (0.578m under the skin once weekly. 30 day(s) (Patient not taking: Reported on 10/07/2021) 2 mL 0   tiZANidine (ZANAFLEX) 4 MG capsule Take 1 capsule (4 mg total) by  mouth 3 (three) times daily. (Patient not taking: Reported on 10/07/2021) 21 capsule 0   No current facility-administered medications for this visit.     PHYSICAL EXAMINATION: ECOG PERFORMANCE STATUS: 0 - Asymptomatic  Vitals:   03/19/22 1109  BP: 130/70  Pulse: (!) 118  Resp: 18  Temp: 97.9 F (36.6 C)  SpO2: 99%   Filed Weights   03/19/22 1109  Weight: (!) 352 lb 8 oz (159.9 kg)    GENERAL:alert, no distress and comfortable, obese SKIN: skin color, texture, turgor are normal, no rashes or significant lesions EYES: normal, conjunctiva are pink and non-injected, sclera clear OROPHARYNX:no exudate, no erythema and lips, buccal mucosa, and tongue normal  NECK: supple, thyroid normal size, non-tender, without nodularity LYMPH:  no palpable lymphadenopathy in the cervical, axillary LUNGS: clear to auscultation and percussion with normal breathing effort HEART: regular rate & rhythm and no murmurs and no lower extremity edema ABDOMEN:abdomen soft, non-tender and normal bowel sounds Musculoskeletal:no cyanosis of digits and no clubbing  PSYCH: alert & oriented x 3 with fluent speech NEURO: no focal motor/sensory deficits  LABORATORY DATA:  I have reviewed the data as  listed Lab Results  Component Value Date   WBC 6.2 10/07/2021   HGB 11.6 (L) 10/07/2021   HCT 34.8 (L) 10/07/2021   MCV 77.9 (L) 10/07/2021   PLT 322 10/07/2021     Chemistry      Component Value Date/Time   NA 136 05/28/2017 1346   K 4.1 05/28/2017 1346   CL 105 05/28/2017 1346   CO2 23 05/28/2017 1346   BUN 8 05/28/2017 1346   CREATININE 0.81 05/28/2017 1346      Component Value Date/Time   CALCIUM 8.7 (L) 05/28/2017 1346   ALKPHOS 174 (H) 05/28/2017 1346   AST 22 05/28/2017 1346   ALT 18 05/28/2017 1346   BILITOT 0.4 05/28/2017 1346       RADIOGRAPHIC STUDIES: I have personally reviewed the radiological images as listed and agreed with the findings in the report. [place] MFM FETAL BPP W/NONSTRESS  Result Date: 03/18/2022 ----------------------------------------------------------------------  OBSTETRICS REPORT                       (Signed Final 03/18/2022 03:17 pm) ---------------------------------------------------------------------- Patient Info  ID #:       [id number]                          D.O.B.:  12-05-1975 (46 yrs)  Name:       Kimberly Joseph                Visit Date: 03/18/2022 09:23 am ---------------------------------------------------------------------- Performed By  Attending:        Tama High MD        Ref. Address:     [facility]                                                             [address]  [address], [address] [place], [place]  Performed By:     Stephenie Acres        Location:         [facility]                    [facility]                                  [facility] at                                                             [facility]                                                             [facility]  Referred By:      Drema Dallas  ---------------------------------------------------------------------- Orders  #  Description                           Code        Ordered By  1  [place] MFM FETAL BPP                      [id number]     Valeda Malm     W/NONSTRESS ----------------------------------------------------------------------  #  Order #                     Accession #                Episode #  1  [id number]                   [id number]                 [id number] ---------------------------------------------------------------------- Indications  Gestational hypertension without significant   O13.3  proteinuria, third trimester  Obesity complicating pregnancy, third          O99.213  trimester (BMI 48)  Advanced maternal age multigravida 51+,        O16.523  third trimester (5 yrs)  Poor obstetric history: Previous preterm       O09.219  delivery, antepartum (34 wks)  Poor obstetric history: Previous gestational   O09.299  diabetes  Poor obstetric history: Previous preeclampsia  O09.299  Encounter for other antenatal screening        Z36.2  follow-up  [redacted] weeks gestation of pregnancy                Z3A.33 ---------------------------------------------------------------------- Vital Signs                                                 Height:        5'6" ---------------------------------------------------------------------- Fetal Evaluation  Num Of Fetuses:         1  Fetal Heart Rate(bpm):  145  Cardiac Activity:       Observed  Presentation:           Breech  Placenta:               Anterior  P. Cord Insertion:      Previously visualized  Amniotic Fluid  AFI FV:      Within normal limits  AFI Sum(cm)     %Tile       Largest Pocket(cm)  13.09           41          6.1  RUQ(cm)       RLQ(cm)       LUQ(cm)        LLQ(cm)  1.58          6.1           2.55           2.86 ---------------------------------------------------------------------- Biophysical Evaluation  Amniotic F.V:   Pocket => 2 cm             F. Tone:        Observed  F. Movement:     Observed                   N.S.T:          Reactive  F. Breathing:   Not Observed               Score:          8/10 ---------------------------------------------------------------------- OB History  Gravidity:    5         Term:   1        Prem:   1        SAB:   2  TOP:          0       Ectopic:  0        Living: 2 ---------------------------------------------------------------------- Gestational Age  LMP:           33w 0d        Date:  07/30/21                  EDD:   05/06/22  Best:          33w 0d     Det. By:  LMP  (07/30/21)          EDD:   05/06/22 ---------------------------------------------------------------------- Anatomy  Cranium:               Appears normal         LVOT:                   Previously seen  Cavum:  Previously             Aortic Arch:            Previously seen                         visualized  Ventricles:            Previously seen        Ductal Arch:            Not well visualized  Choroid Plexus:        Previously seen        Diaphragm:              Appears normal  Cerebellum:            Previously seen        Stomach:                Appears normal, left                                                                        sided  Posterior Fossa:       Previously seen        Abdomen:                Appears normal  Nuchal Fold:           Previously seen        Abdominal Wall:         Previously seen  Face:                  Orbits previously      Cord Vessels:           Previously seen                         seen  Lips:                  Previously seen        Kidneys:                Previously seen  Palate:                Not well visualized    Bladder:                Appears normal  Thoracic:              Previously seen        Spine:                  Previously seen  Heart:                 Not well visualized    Upper Extremities:      Previously seen  RVOT:                  Previously seen        Lower Extremities:      Previously seen  Other:  Female gender previously  seen. Technically difficult due to maternal          habitus and advanced  GA. ---------------------------------------------------------------------- Impression  Patient has a diagnosis of gestational hypertension versus  chronic hypertension.  No signs and symptoms of severe  features of preeclampsia.  Amniotic fluid is normal and good fetal activity seen.  Fetal  breathing movements did not meet the criteria of BPP.  NST  is reactive.  BPP 8/10. Breech presentation.  I reassured the patient of the findings. ----------------------------------------------------------------------                 Tama High, MD Electronically Signed Final Report   03/18/2022 03:17 pm ----------------------------------------------------------------------  [place] MFM OB FOLLOW UP  Result Date: 03/12/2022 ----------------------------------------------------------------------  OBSTETRICS REPORT                       (Signed Final 03/12/2022 01:01 pm) ---------------------------------------------------------------------- Patient Info  ID #:       [id number]                          D.O.B.:  1975-02-11 (46 yrs)  Name:       Kimberly Joseph                Visit Date: 03/12/2022 11:39 am ---------------------------------------------------------------------- Performed By  Attending:        Valeda Malm DO       Ref. Address:     [facility]                                                             [address]                                                             [address] [place], [place]  Performed By:     Nathen May       Location:         [facility]                    RD[facility]                                     [facility]                                                             [facility]  Referred By:      Drema Dallas ---------------------------------------------------------------------- Orders  #  Description                           Code        Ordered By  1  [place] MFM OB FOLLOW UP                   [id number]    Sander Nephew  2  [place] MFM FETAL BPP WO NON               76819.01    CORENTHIAN     STRESS                                            BOOKER ----------------------------------------------------------------------  #  Order #                     Accession #                Episode #  1  [id number]                   [id number]                 [id number]  2  [id number]                   [id number]                 [id number] ---------------------------------------------------------------------- Indications  Obesity complicating pregnancy, third          O99.213  trimester (BMI 83)  Advanced maternal age multigravida 81+,        O87.523  third trimester (46 yrs)  Poor obstetric history: Previous preterm       O09.219  delivery, antepartum (34 wks)  Poor obstetric history: Previous gestational   O09.299  diabetes  Poor obstetric history: Previous preeclampsia  O09.299  Encounter for other antenatal screening        Z36.2  follow-up  [redacted] weeks gestation of pregnancy                Z3A.32 ---------------------------------------------------------------------- Vital Signs                                                 Height:        5'6"  BP:          125/74 ---------------------------------------------------------------------- Fetal Evaluation  Num Of Fetuses:  1  Fetal Heart Rate(bpm):  145  Cardiac Activity:       Observed  Presentation:           Cephalic  Placenta:               Anterior  P. Cord Insertion:      Previously visualized  Amniotic Fluid  AFI FV:      Within normal limits  AFI Sum(cm)     %Tile       Largest Pocket(cm)  13.54           43          6.26  RUQ(cm)       RLQ(cm)       LUQ(cm)        LLQ(cm)  6.26          1.15          2.01           4.12  ---------------------------------------------------------------------- Biophysical Evaluation  Amniotic F.V:   Pocket => 2 cm             F. Tone:        Observed  F. Movement:    Observed                   Score:          8/8  F. Breathing:   Observed ---------------------------------------------------------------------- Biometry  BPD:      82.9  mm     G. Age:  33w 2d         77  %    CI:        75.96   %    70 - 86                                                          FL/HC:      20.8   %    19.1 - 21.3  HC:      301.5  mm     G. Age:  33w 3d         52  %    HC/AC:      1.03        0.96 - 1.17  AC:      293.1  mm     G. Age:  33w 2d         81  %    FL/BPD:     75.6   %    71 - 87  FL:       62.7  mm     G. Age:  32w 3d         46  %    FL/AC:      21.4   %    20 - 24  Est. FW:    2114  gm    4 lb 11 oz      70  % ---------------------------------------------------------------------- OB History  Gravidity:    5         Term:   1        Prem:   1        SAB:   2  TOP:          0  Ectopic:  0        Living: 2 ---------------------------------------------------------------------- Gestational Age  LMP:           32w 1d        Date:  07/30/21                  EDD:   05/06/22  U/S Today:     33w 1d                                        EDD:   04/29/22  Best:          32w 1d     Det. By:  LMP  (07/30/21)          EDD:   05/06/22 ---------------------------------------------------------------------- Anatomy  Cranium:               Appears normal         LVOT:                   Previously seen  Cavum:                 Previously             Aortic Arch:            Previously seen                         visualized  Ventricles:            Previously seen        Ductal Arch:            Not well visualized  Choroid Plexus:        Previously seen        Diaphragm:              Appears normal  Cerebellum:            Previously seen        Stomach:                Appears normal, left                                                                         sided  Posterior Fossa:       Previously seen        Abdomen:                Appears normal  Nuchal Fold:           Previously seen        Abdominal Wall:         Previously seen  Face:                  Orbits previously      Cord Vessels:           Previously seen                         seen  Lips:  Previously seen        Kidneys:                Previously seen  Palate:                Not well visualized    Bladder:                Appears normal  Thoracic:              Previously seen        Spine:                  Previously seen  Heart:                 Not well visualized    Upper Extremities:      Previously seen  RVOT:                  Previously seen        Lower Extremities:      Previously seen  Other:  Female gender previously seen. Technically difficult due to maternal          habitus and advanced GA. ---------------------------------------------------------------------- Targeted Anatomy  Central Nervous System  Midline Falx:          Appears normal  Head/Neck  Nasal Bone:            Not well visualized    Mandible:               Not well visualized  Profile:               Not well visualized    Maxilla:                Not well visualized  Orbits/Eyes:           Seen on prior scan  Thorax  SVC:                   Seen on prior scan     3 V Trachea View:       Seen on prior scan  3 Vessel View:         Seen on prior scan     IVC:                    Seen on prior scan  Extremities  Lt Hand:               Seen on prior scan     Lt Foot:                Seen on prior scan  Rt Hand:               Open hand nml          Rt Foot:                Seen on prior scan ---------------------------------------------------------------------- Comments  The patient is here for a follow-up BPP and growth ultrasound  at 32w 1d for elevated BMI, hx of preE, AMA. EDD:  05/06/2022 dated by LMP  (07/30/21). She has no concerns  today.  Sonographic findings  Single intrauterine  pregnancy.  Fetal cardiac activity: Observed.  Presentation: Cephalic.  Interval fetal anatomy appears normal.  Fetal biometry shows the estimated fetal weight at the 70  percentile.  Amniotic fluid volume: Within normal limits. AFI: 13.54 cm.  MVP: 6.26 cm.  Placenta: Anterior.  BPP: .  Recommendations  1. BBPs weekly until delivery  2. Growth ultrasounds every 4 weeks until delivery  3. Delivery around [redacted] weeks gestation ----------------------------------------------------------------------                 Valeda Malm, DO Electronically Signed Final Report   03/12/2022 01:01 pm ----------------------------------------------------------------------  [place] MFM FETAL BPP WO NON STRESS  Result Date: 03/12/2022 ----------------------------------------------------------------------  OBSTETRICS REPORT                       (Signed Final 03/12/2022 01:01 pm) ---------------------------------------------------------------------- Patient Info  ID #:       [id number]                          D.O.B.:  Dec 24, 1975 (46 yrs)  Name:       Kimberly Joseph                Visit Date: 03/12/2022 11:39 am ---------------------------------------------------------------------- Performed By  Attending:        Valeda Malm DO       Ref. Address:     [facility]                                                             [address]                                                             [address] [place], [place]  Performed By:     Nathen May       Location[facility]         [facility]                    [facility]                                     [facility] Care at                                                             [facility]  for                                                             Women  Referred By:      Drema Dallas ----------------------------------------------------------------------  Orders  #  Description                           Code        Ordered By  1  [place] MFM OB FOLLOW UP                   [id number]    Sander Nephew  2  [place] MFM FETAL BPP WO NON               76819.01    CORENTHIAN     STRESS                                            BOOKER ----------------------------------------------------------------------  #  Order #                     Accession #                Episode #  1  [id number]                   [id number]                 [id number]  2  [id number]                   [id number]                 [id number] ---------------------------------------------------------------------- Indications  Obesity complicating pregnancy, third          O99.213  trimester (BMI 36)  Advanced maternal age multigravida 54+,        O9.523  third trimester (89 yrs)  Poor obstetric history: Previous preterm       O09.219  delivery, antepartum (34 wks)  Poor obstetric history: Previous gestational   O09.299  diabetes  Poor obstetric history: Previous preeclampsia  O09.299  Encounter for other antenatal screening        Z36.2  follow-up  [redacted] weeks gestation of pregnancy                Z3A.32 ---------------------------------------------------------------------- Vital Signs                                                 Height:        5'6"  BP:          125/74 ---------------------------------------------------------------------- Fetal Evaluation  Num Of Fetuses:  1  Fetal Heart Rate(bpm):  145  Cardiac Activity:       Observed  Presentation:           Cephalic  Placenta:               Anterior  P. Cord Insertion:      Previously visualized  Amniotic Fluid  AFI FV:      Within normal limits  AFI Sum(cm)     %Tile       Largest Pocket(cm)  13.54           43          6.26  RUQ(cm)       RLQ(cm)       LUQ(cm)        LLQ(cm)  6.26          1.15          2.01           4.12 ----------------------------------------------------------------------  Biophysical Evaluation  Amniotic F.V:   Pocket => 2 cm             F. Tone:        Observed  F. Movement:    Observed                   Score:          8/8  F. Breathing:   Observed ---------------------------------------------------------------------- Biometry  BPD:      82.9  mm     G. Age:  33w 2d         77  %    CI:        75.96   %    70 - 86                                                          FL/HC:      20.8   %    19.1 - 21.3  HC:      301.5  mm     G. Age:  33w 3d         21  %    HC/AC:      1.03        0.96 - 1.17  AC:      293.1  mm     G. Age:  33w 2d         81  %    FL/BPD:     75.6   %    71 - 87  FL:       62.7  mm     G. Age:  32w 3d         46  %    FL/AC:      21.4   %    20 - 24  Est. FW:    2114  gm    4 lb 11 oz      70  % ---------------------------------------------------------------------- OB History  Gravidity:    5         Term:   1        Prem:   1        SAB:   2  TOP:          0  Ectopic:  0        Living: 2 ---------------------------------------------------------------------- Gestational Age  LMP:           32w 1d        Date:  07/30/21                  EDD:   05/06/22  U/S Today:     33w 1d                                        EDD:   04/29/22  Best:          32w 1d     Det. By:  LMP  (07/30/21)          EDD:   05/06/22 ---------------------------------------------------------------------- Anatomy  Cranium:               Appears normal         LVOT:                   Previously seen  Cavum:                 Previously             Aortic Arch:            Previously seen                         visualized  Ventricles:            Previously seen        Ductal Arch:            Not well visualized  Choroid Plexus:        Previously seen        Diaphragm:              Appears normal  Cerebellum:            Previously seen        Stomach:                Appears normal, left                                                                        sided  Posterior Fossa:       Previously  seen        Abdomen:                Appears normal  Nuchal Fold:           Previously seen        Abdominal Wall:         Previously seen  Face:                  Orbits previously      Cord Vessels:           Previously seen                         seen  Lips:  Previously seen        Kidneys:                Previously seen  Palate:                Not well visualized    Bladder:                Appears normal  Thoracic:              Previously seen        Spine:                  Previously seen  Heart:                 Not well visualized    Upper Extremities:      Previously seen  RVOT:                  Previously seen        Lower Extremities:      Previously seen  Other:  Female gender previously seen. Technically difficult due to maternal          habitus and advanced GA. ---------------------------------------------------------------------- Targeted Anatomy  Central Nervous System  Midline Falx:          Appears normal  Head/Neck  Nasal Bone:            Not well visualized    Mandible:               Not well visualized  Profile:               Not well visualized    Maxilla:                Not well visualized  Orbits/Eyes:           Seen on prior scan  Thorax  SVC:                   Seen on prior scan     3 V Trachea View:       Seen on prior scan  3 Vessel View:         Seen on prior scan     IVC:                    Seen on prior scan  Extremities  Lt Hand:               Seen on prior scan     Lt Foot:                Seen on prior scan  Rt Hand:               Open hand nml          Rt Foot:                Seen on prior scan ---------------------------------------------------------------------- Comments  The patient is here for a follow-up BPP and growth ultrasound  at 32w 1d for elevated BMI, hx of preE, AMA. EDD:  05/06/2022 dated by LMP  (07/30/21). She has no concerns  today.  Sonographic findings  Single intrauterine pregnancy.  Fetal cardiac activity: Observed.  Presentation: Cephalic.   Interval fetal anatomy appears normal.  Fetal biometry shows the estimated fetal weight at the 70  percentile.  Amniotic fluid volume: Within normal limits. AFI: 13.54 cm.  MVP: 6.26 cm.  Placenta: Anterior.  BPP: .  Recommendations  1. BBPs weekly until delivery  2. Growth ultrasounds every 4 weeks until delivery  3. Delivery around [redacted] weeks gestation ----------------------------------------------------------------------                 Valeda Malm, DO Electronically Signed Final Report   03/12/2022 01:01 pm ----------------------------------------------------------------------   All questions were answered. The patient knows to call the clinic with any problems, questions or concerns. I spent 45 minutes in the care of this patient including H and P, review of records, counseling and coordination of care.     Benay Pike, MD 03/19/2022 11:33 AM

## 2022-03-23 ENCOUNTER — Telehealth: Payer: Self-pay | Admitting: *Deleted

## 2022-03-23 NOTE — Telephone Encounter (Signed)
Contacted patient with message from Dr. Chryl Heck below. Patient verbalized understanding of information.

## 2022-03-23 NOTE — Progress Notes (Signed)
Hb 11.4. NO indication for iron infusion

## 2022-03-23 NOTE — Telephone Encounter (Signed)
-----   Message from Benay Pike, MD sent at 03/20/2022  5:21 PM EST ----- I recommend continuing oral iron. Hemoglobin at 11.2. No indication for IV Iron

## 2022-03-26 ENCOUNTER — Encounter: Payer: Self-pay | Admitting: *Deleted

## 2022-03-26 ENCOUNTER — Ambulatory Visit: Payer: Medicaid Other | Attending: Maternal & Fetal Medicine

## 2022-03-26 ENCOUNTER — Ambulatory Visit: Payer: Medicaid Other | Admitting: *Deleted

## 2022-03-26 DIAGNOSIS — O09893 Supervision of other high risk pregnancies, third trimester: Secondary | ICD-10-CM | POA: Diagnosis not present

## 2022-03-26 DIAGNOSIS — O09293 Supervision of pregnancy with other poor reproductive or obstetric history, third trimester: Secondary | ICD-10-CM | POA: Insufficient documentation

## 2022-03-26 DIAGNOSIS — O09523 Supervision of elderly multigravida, third trimester: Secondary | ICD-10-CM | POA: Insufficient documentation

## 2022-03-26 DIAGNOSIS — Z8632 Personal history of gestational diabetes: Secondary | ICD-10-CM

## 2022-03-26 DIAGNOSIS — E669 Obesity, unspecified: Secondary | ICD-10-CM | POA: Diagnosis not present

## 2022-03-26 DIAGNOSIS — O133 Gestational [pregnancy-induced] hypertension without significant proteinuria, third trimester: Secondary | ICD-10-CM | POA: Diagnosis not present

## 2022-03-26 DIAGNOSIS — O09213 Supervision of pregnancy with history of pre-term labor, third trimester: Secondary | ICD-10-CM

## 2022-03-26 DIAGNOSIS — O99213 Obesity complicating pregnancy, third trimester: Secondary | ICD-10-CM | POA: Insufficient documentation

## 2022-03-26 DIAGNOSIS — Z3A34 34 weeks gestation of pregnancy: Secondary | ICD-10-CM

## 2022-03-26 NOTE — Progress Notes (Signed)
Patient C/O shortness of breath. O2 sat 100%. Denies illness. States she thinks it is from anemia. C/O pelvic pain. Slowness in ambulation.

## 2022-03-27 ENCOUNTER — Encounter: Payer: Self-pay | Admitting: Hematology and Oncology

## 2022-03-27 ENCOUNTER — Inpatient Hospital Stay (HOSPITAL_BASED_OUTPATIENT_CLINIC_OR_DEPARTMENT_OTHER): Payer: Medicaid Other | Admitting: Hematology and Oncology

## 2022-03-27 DIAGNOSIS — D509 Iron deficiency anemia, unspecified: Secondary | ICD-10-CM

## 2022-03-27 NOTE — Progress Notes (Signed)
[place] NOTE  Patient Care Team: Christophe Louis, MD as PCP - General (Obstetrics and Gynecology) Drema Dallas, DO as PCP - OBGYN (Obstetrics and Gynecology)  CHIEF COMPLAINTS/PURPOSE OF CONSULTATION:  IDA  ASSESSMENT & PLAN:   This is a very pleasant 47 year old female patient who is currently [redacted] weeks pregnant, Jehovah's Witness referred to hematology for consideration of iron infusion given iron deficiency anemia.   She is here for telephone visit, denies any new complaints except for fatigue. We have reviewed her last hemoglobin which is over 11 g/dL.  We have discussed about continuing oral iron and prenatal vitamins during the pregnancy, through breast-feeding. She is agreeable to this plan.  She can return to hematology as needed.  I do not see any immediate indication for iron infusion and she was also very reluctant about this.  We can certainly consider this in the postpartum setting if she is willing to.   HISTORY OF PRESENTING ILLNESS:  Kimberly Joseph 47 y.o. female is here because of IDA  This is a very pleasant 47 year old female patient with currently at 23 to [redacted] weeks pregnant and an Jehovah witness referred to hematology for evaluation of iron deficiency anemia.  She is doing well except for fatigue.  She denies any other major issues today.  She is taking the oral iron as prescribed. Rest of the pertinent 10 point ROS reviewed and negative   MEDICAL HISTORY:  Past Medical History:  Diagnosis Date   Pregnancy induced hypertension     SURGICAL HISTORY: Past Surgical History:  Procedure Laterality Date   NO PAST SURGERIES      SOCIAL HISTORY: Social History   Socioeconomic History   Marital status: Married    Spouse name: Not on file   Number of children: Not on file   Years of education: Not on file   Highest education level: Not on file  Occupational History   Not on file  Tobacco Use   Smoking status: Never   Smokeless  tobacco: Never  Vaping Use   Vaping Use: Never used  Substance and Sexual Activity   Alcohol use: No   Drug use: No   Sexual activity: Yes    Birth control/protection: None  Other Topics Concern   Not on file  Social History Narrative   Not on file   Social Determinants of Health   Financial Resource Strain: Not on file  Food Insecurity: Not on file  Transportation Needs: Not on file  Physical Activity: Not on file  Stress: Not on file  Social Connections: Not on file  Intimate Partner Violence: Not on file    FAMILY HISTORY: Family History  Problem Relation Age of Onset   Asthma Mother    Hypertension Mother    Miscarriages / [place] Mother    Cancer Father    Hypertension Father    Vision loss Father    Stroke Maternal Grandfather    Heart disease Neg Hx    Diabetes Neg Hx     ALLERGIES:  has No Known Allergies.  MEDICATIONS:  Current Outpatient Medications  Medication Sig Dispense Refill   Accu-Chek FastClix Lancets MISC use as directed to check blood sugar daily (Patient not taking: Reported on 12/10/2021) 102 each 0   Blood Glucose Monitoring Suppl (ONETOUCH VERIO REFLECT) w/Device KIT Use daily as needed. (Patient not taking: Reported on 12/10/2021) 1 kit 0   Blood Glucose Monitoring Suppl (ONETOUCH VERIO REFLECT) w/Device KIT Use to check blood sugar  daily as needed. (Patient not taking: Reported on 12/10/2021) 1 kit 0   famotidine (PEPCID) 40 MG tablet Take 1 tablet (40 mg total) by mouth at bedtime. 30 tablet 6   glucose blood test strip Use as directed to check blood sugar daily as needed. (Patient not taking: Reported on 01/08/2022) 50 each 0   glycopyrrolate (ROBINUL) 1 MG tablet Take 1 tablet (1 mg total) by mouth 3 (three) times daily. (Patient not taking: Reported on 01/08/2022) 90 tablet 0   Lancets (ONETOUCH DELICA PLUS 123XX123) MISC use as directed to test blood sugars once a day as needed (Patient not taking: Reported on 12/10/2021) 100 each 0    metoCLOPramide (REGLAN) 10 MG tablet Take 1 tablet (10 mg total) by mouth 3 (three) times daily before meals. (Patient not taking: Reported on 01/08/2022) 60 tablet 1   omeprazole (PRILOSEC) 20 MG capsule Take 1 capsule by mouth once daily. 30 day(s) (Patient not taking: Reported on 03/12/2022) 30 capsule 2   omeprazole (PRILOSEC) 20 MG capsule Take 1 capsule by mouth once daily. (Patient not taking: Reported on 10/07/2021) 30 capsule 2   ondansetron (ZOFRAN-ODT) 8 MG disintegrating tablet Dissolve 1 tablet (8 mg total) by mouth every 8 (eight) hours as needed. 90 tablet 4   Prenat w/o A-FeCbGl-DSS-FA-DHA (CITRANATAL 90 DHA) 90-1 & 300 MG MISC Take 1 tablet by mouth daily (Patient not taking: Reported on 12/10/2021) 30 each 12   Prenatal Vit w/Fe-Methylfol-FA (PNV PO) Take 1 tablet by mouth daily.      promethazine (PHENERGAN) 25 MG tablet Take 1 tablet (25 mg total) by mouth every 6 (six) hours as needed. (Patient not taking: Reported on 12/10/2021) 120 tablet 4   Semaglutide-Weight Management (WEGOVY) 0.25 MG/0.5ML SOAJ Inject 0.'25mg'$  (0.73m) under the skin once weekly. 30 day(s) (Patient not taking: Reported on 10/07/2021) 2 mL 0   tiZANidine (ZANAFLEX) 4 MG capsule Take 1 capsule (4 mg total) by mouth 3 (three) times daily. (Patient not taking: Reported on 10/07/2021) 21 capsule 0   No current facility-administered medications for this visit.     PHYSICAL EXAMINATION: ECOG PERFORMANCE STATUS: 0 - Asymptomatic  There were no vitals filed for this visit.  There were no vitals filed for this visit.   GENERAL:alert, no distress and comfortable, obese SKIN: skin color, texture, turgor are normal, no rashes or significant lesions EYES: normal, conjunctiva are pink and non-injected, sclera clear OROPHARYNX:no exudate, no erythema and lips, buccal mucosa, and tongue normal  NECK: supple, thyroid normal size, non-tender, without nodularity LYMPH:  no palpable lymphadenopathy in the cervical,  axillary LUNGS: clear to auscultation and percussion with normal breathing effort HEART: regular rate & rhythm and no murmurs and no lower extremity edema ABDOMEN:abdomen soft, non-tender and normal bowel sounds Musculoskeletal:no cyanosis of digits and no clubbing  PSYCH: alert & oriented x 3 with fluent speech NEURO: no focal motor/sensory deficits  LABORATORY DATA:  I have reviewed the data as listed Lab Results  Component Value Date   WBC 6.7 03/19/2022   HGB 11.4 (L) 03/19/2022   HCT 34.5 (L) 03/19/2022   MCV 77.2 (L) 03/19/2022   PLT 273 03/19/2022     Chemistry      Component Value Date/Time   NA 136 05/28/2017 1346   K 4.1 05/28/2017 1346   CL 105 05/28/2017 1346   CO2 23 05/28/2017 1346   BUN 8 05/28/2017 1346   CREATININE 0.81 05/28/2017 1346      Component Value Date/Time  CALCIUM 8.7 (L) 05/28/2017 1346   ALKPHOS 174 (H) 05/28/2017 1346   AST 22 05/28/2017 1346   ALT 18 05/28/2017 1346   BILITOT 0.4 05/28/2017 1346       RADIOGRAPHIC STUDIES: I have personally reviewed the radiological images as listed and agreed with the findings in the report. [place] MFM FETAL BPP WO NON STRESS  Result Date: 03/26/2022 ----------------------------------------------------------------------  OBSTETRICS REPORT                       (Signed Final 03/26/2022 03:01 pm) ---------------------------------------------------------------------- Patient Info  ID #:       [id number]                          D.O.B.:  Oct 03, 1975 (46 yrs)  Name:       Kimberly Joseph                Visit Date: 03/26/2022 02:00 pm ---------------------------------------------------------------------- Performed By  Attending:        Valeda Malm DO       Ref. Address:     [facility]                                                             [address]                                                             [address] [place], [place]  Performed By:     Jacob Moores BS,       Location:         [facility]                    RDMS, RVT                                [facility] at                                                             [place] [facility]  Referred By:      Drema Dallas ---------------------------------------------------------------------- Orders  #  Description                           Code        Ordered By  1  [place] MFM FETAL BPP WO NON               76819.01    [facility]     STRESS ----------------------------------------------------------------------  #  Order #                     Accession #                Episode #  1  [id number]                   [id number]                 [id number] ---------------------------------------------------------------------- Indications  Gestational hypertension without significant   O13.3  proteinuria, third trimester  Obesity complicating pregnancy, third          O99.213  trimester (BMI 1)  Advanced maternal age multigravida 59+,        O59.523  third trimester (80 yrs)  Poor obstetric history: Previous preterm       O09.219  delivery, antepartum (34 wks)  Poor obstetric history: Previous gestational   O09.299  diabetes  Poor obstetric history: Previous preeclampsia  O09.299  Encounter for other antenatal screening        Z36.2  follow-up  [redacted] weeks gestation of pregnancy                Z3A.34 ---------------------------------------------------------------------- Vital Signs                                                 Height:        5'6" ---------------------------------------------------------------------- Fetal Evaluation  Num Of Fetuses:         1  Fetal Heart Rate(bpm):  151  Cardiac Activity:       Observed  Presentation:           Cephalic  Placenta:               Anterior  P. Cord Insertion:      Previously visualized  Amniotic Fluid  AFI FV:      Within  normal limits  AFI Sum(cm)     %Tile       Largest Pocket(cm)  9.83            18          4.21  RUQ(cm)       RLQ(cm)       LUQ(cm)        LLQ(cm)  0             2.38          4.21           3.24 ---------------------------------------------------------------------- Biophysical Evaluation  Amniotic F.V:   Pocket => 2 cm             F. Tone:        Observed  F. Movement:    Observed  Score:          8/8  F. Breathing:   Observed ---------------------------------------------------------------------- OB History  Gravidity:    5         Term:   1        Prem:   1        SAB:   2  TOP:          0       Ectopic:  0        Living: 2 ---------------------------------------------------------------------- Gestational Age  LMP:           34w 1d        Date:  07/30/21                  EDD:   05/06/22  Best:          34w 1d     Det. By:  LMP  (07/30/21)          EDD:   05/06/22 ---------------------------------------------------------------------- Anatomy  Cranium:               Appears normal         LVOT:                   Previously seen  Cavum:                 Previously             Aortic Arch:            Previously seen                         visualized  Ventricles:            Appears normal         Ductal Arch:            Not well visualized  Choroid Plexus:        Previously seen        Diaphragm:              Appears normal  Cerebellum:            Previously seen        Stomach:                Appears normal, left                                                                        sided  Posterior Fossa:       Previously seen        Abdomen:                Appears normal  Nuchal Fold:           Previously seen        Abdominal Wall:         Previously seen  Face:                  Orbits previously      Cord Vessels:           Previously seen  seen  Lips:                  Previously seen        Kidneys:                Appear normal  Palate:                Not well visualized     Bladder:                Appears normal  Thoracic:              Appears normal         Spine:                  Previously seen  Heart:                 Not well visualized    Upper Extremities:      Previously seen  RVOT:                  Previously seen        Lower Extremities:      Previously seen  Other:  Female gender previously seen. Technically difficult due to maternal          habitus and advanced GA. ---------------------------------------------------------------------- Cervix Uterus Adnexa  Cervix  Not visualized (advanced GA >24wks)  Uterus  No abnormality visualized.  Right Ovary  Within normal limits.  Left Ovary  Within normal limits.  Cul De Sac  No free fluid seen.  Adnexa  No abnormality visualized ---------------------------------------------------------------------- Comments  The patient is here for a BPP for gHTN, elevated BMI. She is  at [facility]. EDD of 05/06/2022 dated by: LMP  (07/30/21).  She has no concerns today.  Sonographic findings  Single intrauterine pregnancy.  Fetal cardiac activity: Observed.  Presentation: Cephalic.  Interval fetal anatomy appears normal.  Amniotic fluid volume: Within normal limits. AFI: 9.83 cm.  MVP: 4.21 cm.  Placenta: Anterior.  BPP: 8/8.  Recommendations  1. BBPs weekly until delivery  2. Growth ultrasounds every 4 weeks until delivery  3. Delivery around [redacted] weeks gestation ----------------------------------------------------------------------                  Valeda Malm, DO Electronically Signed Final Report   03/26/2022 03:01 pm ----------------------------------------------------------------------  [place] MFM FETAL BPP W/NONSTRESS  Result Date: 03/18/2022 ----------------------------------------------------------------------  OBSTETRICS REPORT                       (Signed Final 03/18/2022 03:17 pm) ---------------------------------------------------------------------- Patient Info  ID #:       [id number]                          D.O.B.:  30-Jul-1975 (46 yrs)   Name:       Kimberly Joseph                Visit Date: 03/18/2022 09:23 am ---------------------------------------------------------------------- Performed By  Attending:        Tama High MD        Ref. Address:     [facility]  [address] [place], [place]  Performed By:     Stephenie Acres        Location:         [facility]                    BS RDMS                                  Fetal [facility] at                                                             [place] for                                                             Women  Referred By:      Drema Dallas ---------------------------------------------------------------------- Orders  #  Description                           Code        Ordered By  1  [place] MFM FETAL BPP                      [id number]     Valeda Malm     W/NONSTRESS ----------------------------------------------------------------------  #  Order #                     Accession #                Episode #  1  [id number]                   [id number]                 [id number] ---------------------------------------------------------------------- Indications  Gestational hypertension without significant   O13.3  proteinuria, third trimester  Obesity complicating pregnancy, third  [id number]  trimester (BMI 43)  Advanced maternal age multigravida 31+,        O70.523  third trimester (28 yrs)  Poor obstetric history: Previous preterm       O09.219  delivery, antepartum (34 wks)  Poor obstetric history: Previous gestational   O09.299  diabetes  Poor obstetric history: Previous preeclampsia  O09.299  Encounter for other antenatal screening        Z36.2  follow-up  [redacted] weeks gestation of pregnancy                Z3A.33  ---------------------------------------------------------------------- Vital Signs                                                 Height:        5'6" ---------------------------------------------------------------------- Fetal Evaluation  Num Of Fetuses:         1  Fetal Heart Rate(bpm):  145  Cardiac Activity:       Observed  Presentation:           Breech  Placenta:               Anterior  P. Cord Insertion:      Previously visualized  Amniotic Fluid  AFI FV:      Within normal limits  AFI Sum(cm)     %Tile       Largest Pocket(cm)  13.09           41          6.1  RUQ(cm)       RLQ(cm)       LUQ(cm)        LLQ(cm)  1.58          6.1           2.55           2.86 ---------------------------------------------------------------------- Biophysical Evaluation  Amniotic F.V:   Pocket => 2 cm             F. Tone:        Observed  F. Movement:    Observed                   N.S.T:          Reactive  F. Breathing:   Not Observed               Score:          8/10 ---------------------------------------------------------------------- OB History  Gravidity:    5         Term:   1        Prem:   1        SAB:   2  TOP:          0       Ectopic:  0        Living: 2 ---------------------------------------------------------------------- Gestational Age  LMP:           33w 0d        Date:  07/30/21                  EDD:   05/06/22  Best:          33w 0d     Det. By:  LMP  (07/30/21)          EDD:  05/06/22 ---------------------------------------------------------------------- Anatomy  Cranium:               Appears normal         LVOT:                   Previously seen  Cavum:                 Previously             Aortic Arch:            Previously seen                         visualized  Ventricles:            Previously seen        Ductal Arch:            Not well visualized  Choroid Plexus:        Previously seen        Diaphragm:              Appears normal  Cerebellum:            Previously seen        Stomach:                 Appears normal, left                                                                        sided  Posterior Fossa:       Previously seen        Abdomen:                Appears normal  Nuchal Fold:           Previously seen        Abdominal Wall:         Previously seen  Face:                  Orbits previously      Cord Vessels:           Previously seen                         seen  Lips:                  Previously seen        Kidneys:                Previously seen  Palate:                Not well visualized    Bladder:                Appears normal  Thoracic:              Previously seen        Spine:                  Previously seen  Heart:                 Not well visualized    Upper Extremities:  Previously seen  RVOT:                  Previously seen        Lower Extremities:      Previously seen  Other:  Female gender previously seen. Technically difficult due to maternal          habitus and advanced GA. ---------------------------------------------------------------------- Impression  Patient has a diagnosis of gestational hypertension versus  chronic hypertension.  No signs and symptoms of severe  features of preeclampsia.  Amniotic fluid is normal and good fetal activity seen.  Fetal  breathing movements did not meet the criteria of BPP.  NST  is reactive.  BPP 8/10. Breech presentation.  I reassured the patient of the findings. ----------------------------------------------------------------------                 Tama High, MD Electronically Signed Final Report   03/18/2022 03:17 pm ----------------------------------------------------------------------  [place] MFM OB FOLLOW UP  Result Date: 03/12/2022 ----------------------------------------------------------------------  OBSTETRICS REPORT                       (Signed Final 03/12/2022 01:01 pm) ---------------------------------------------------------------------- Patient Info  ID #:       [id number]                          D.O.B.:  Mar 15, 1975 (46  yrs)  Name:       Kimberly Joseph                Visit Date: 03/12/2022 11:39 am ---------------------------------------------------------------------- Performed By  Attending:        Valeda Malm DO       Ref. Address:     [facility]                                                             [address]                                                             [address] [place], [place]  Performed By:     Nathen May       Location:         [facility]                    RDMS  [facility] at                                                             [place] for                                                             Women  Referred By:      Drema Dallas ---------------------------------------------------------------------- Orders  #  Description                           Code        Ordered By  1  [place] MFM OB FOLLOW UP                   [id number]    Sander Nephew  2  [place] MFM FETAL BPP WO NON               76819.01    [facility]     STRESS                                            BOOKER ----------------------------------------------------------------------  #  Order #                     Accession #                Episode #  1  [id number]                   [id number]                 [id number]  2  [id number]                   [id number]                 [id number] ---------------------------------------------------------------------- Indications  Obesity complicating pregnancy, third          O99.213  trimester (BMI 37)  Advanced maternal age multigravida 45+,        O48.523  third trimester (89 yrs)  Poor obstetric history: Previous preterm       O09.219  delivery, antepartum (34 wks)  Poor obstetric history: Previous gestational   O09.299  diabetes  Poor obstetric history:  Previous preeclampsia  O09.299  Encounter for other antenatal screening        Z36.2  follow-up  [redacted] weeks gestation of pregnancy                Z3A.32 ---------------------------------------------------------------------- Vital Signs  Height:        5'6"  BP:          125/74 ---------------------------------------------------------------------- Fetal Evaluation  Num Of Fetuses:         1  Fetal Heart Rate(bpm):  145  Cardiac Activity:       Observed  Presentation:           Cephalic  Placenta:               Anterior  P. Cord Insertion:      Previously visualized  Amniotic Fluid  AFI FV:      Within normal limits  AFI Sum(cm)     %Tile       Largest Pocket(cm)  13.54           43          6.26  RUQ(cm)       RLQ(cm)       LUQ(cm)        LLQ(cm)  6.26          1.15          2.01           4.12 ---------------------------------------------------------------------- Biophysical Evaluation  Amniotic F.V:   Pocket => 2 cm             F. Tone:        Observed  F. Movement:    Observed                   Score:          8/8  F. Breathing:   Observed ---------------------------------------------------------------------- Biometry  BPD:      82.9  mm     G. Age:  33w 2d         77  %    CI:        75.96   %    70 - 86                                                          FL/HC:      20.8   %    19.1 - 21.3  HC:      301.5  mm     G. Age:  33w 3d         65  %    HC/AC:      1.03        0.96 - 1.17  AC:      293.1  mm     G. Age:  33w 2d         81  %    FL/BPD:     75.6   %    71 - 87  FL:       62.7  mm     G. Age:  32w 3d         46  %    FL/AC:      21.4   %    20 - 24  Est. FW:    2114  gm    4 lb 11 oz      70  % ---------------------------------------------------------------------- OB History  Gravidity:    5         Term:   1  Prem:   1        SAB:   2  TOP:          0       Ectopic:  0        Living: 2  ---------------------------------------------------------------------- Gestational Age  LMP:           32w 1d        Date:  07/30/21                  EDD:   05/06/22  U/S Today:     33w 1d                                        EDD:   04/29/22  Best:          32w 1d     Det. By:  LMP  (07/30/21)          EDD:   05/06/22 ---------------------------------------------------------------------- Anatomy  Cranium:               Appears normal         LVOT:                   Previously seen  Cavum:                 Previously             Aortic Arch:            Previously seen                         visualized  Ventricles:            Previously seen        Ductal Arch:            Not well visualized  Choroid Plexus:        Previously seen        Diaphragm:              Appears normal  Cerebellum:            Previously seen        Stomach:                Appears normal, left                                                                        sided  Posterior Fossa:       Previously seen        Abdomen:                Appears normal  Nuchal Fold:           Previously seen        Abdominal Wall:         Previously seen  Face:                  Orbits previously      Cord Vessels:           Previously seen  seen  Lips:                  Previously seen        Kidneys:                Previously seen  Palate:                Not well visualized    Bladder:                Appears normal  Thoracic:              Previously seen        Spine:                  Previously seen  Heart:                 Not well visualized    Upper Extremities:      Previously seen  RVOT:                  Previously seen        Lower Extremities:      Previously seen  Other:  Female gender previously seen. Technically difficult due to maternal          habitus and advanced GA. ---------------------------------------------------------------------- Targeted Anatomy  Central Nervous System  Midline Falx:          Appears normal  Head/Neck   Nasal Bone:            Not well visualized    Mandible:               Not well visualized  Profile:               Not well visualized    Maxilla:                Not well visualized  Orbits/Eyes:           Seen on prior scan  Thorax  SVC:                   Seen on prior scan     3 V Trachea View:       Seen on prior scan  3 Vessel View:         Seen on prior scan     IVC:                    Seen on prior scan  Extremities  Lt Hand:               Seen on prior scan     Lt Foot:                Seen on prior scan  Rt Hand:               Open hand nml          Rt Foot:                Seen on prior scan ---------------------------------------------------------------------- Comments  The patient is here for a follow-up BPP and growth ultrasound  at 32w 1d for elevated BMI, hx of preE, AMA. EDD:  05/06/2022 dated by LMP  (07/30/21). She has no concerns  today.  Sonographic findings  Single intrauterine pregnancy.  Fetal cardiac activity: Observed.  Presentation: Cephalic.  Interval fetal anatomy appears normal.  Fetal biometry shows the estimated fetal weight  at the 70  percentile.  Amniotic fluid volume: Within normal limits. AFI: 13.54 cm.  MVP: 6.26 cm.  Placenta: Anterior.  BPP: .  Recommendations  1. BBPs weekly until delivery  2. Growth ultrasounds every 4 weeks until delivery  3. Delivery around [redacted] weeks gestation ----------------------------------------------------------------------                 Valeda Malm, DO Electronically Signed Final Report   03/12/2022 01:01 pm ----------------------------------------------------------------------  [place] MFM FETAL BPP WO NON STRESS  Result Date: 03/12/2022 ----------------------------------------------------------------------  OBSTETRICS REPORT                       (Signed Final 03/12/2022 01:01 pm) ---------------------------------------------------------------------- Patient Info  ID #:       [id number]                          D.O.B.:  Nov 01, 1975 (46 yrs)  Name:        Kimberly Joseph                Visit Date: 03/12/2022 11:39 am ---------------------------------------------------------------------- Performed By  Attending:        Valeda Malm DO       Ref. Address:     [facility]                                                             [address]                                                             [address] [place], [place]  Performed By:     Nathen May       [facility]  Referred By:      Drema Dallas ---------------------------------------------------------------------- Orders  #  Description                           Code        Ordered By  1  [place] MFM OB FOLLOW UP                   [id number]    Sander Nephew  2  [place] MFM FETAL BPP WO NON               76819.01    CORENTHIAN     STRESS                                            BOOKER ----------------------------------------------------------------------  #  Order #                     Accession #                Episode #  1  [id number]                   [id number]                 [id number]  2  [id number]                   [id number]                 [id number] ---------------------------------------------------------------------- Indications  Obesity complicating pregnancy, third          O99.213  trimester (BMI 44)  Advanced maternal age multigravida 37+,        O33.523  third trimester (10 yrs)  Poor obstetric history: Previous preterm       O09.219  delivery, antepartum (34 wks)  Poor obstetric history: Previous gestational   O09.299  diabetes  Poor obstetric history: Previous  preeclampsia  O09.299  Encounter for other antenatal screening        Z36.2  follow-up  [redacted] weeks gestation of pregnancy                Z3A.32 ---------------------------------------------------------------------- Vital Signs                                                 Height:        5'6"  BP:          125/74 ---------------------------------------------------------------------- Fetal Evaluation  Num Of Fetuses:  1  Fetal Heart Rate(bpm):  145  Cardiac Activity:       Observed  Presentation:           Cephalic  Placenta:               Anterior  P. Cord Insertion:      Previously visualized  Amniotic Fluid  AFI FV:      Within normal limits  AFI Sum(cm)     %Tile       Largest Pocket(cm)  13.54           43          6.26  RUQ(cm)       RLQ(cm)       LUQ(cm)        LLQ(cm)  6.26          1.15          2.01           4.12 ---------------------------------------------------------------------- Biophysical Evaluation  Amniotic F.V:   Pocket => 2 cm             F. Tone:        Observed  F. Movement:    Observed                   Score:          8/8  F. Breathing:   Observed ---------------------------------------------------------------------- Biometry  BPD:      82.9  mm     G. Age:  33w 2d         77  %    CI:        75.96   %    70 - 86                                                          FL/HC:      20.8   %    19.1 - 21.3  HC:      301.5  mm     G. Age:  33w 3d         34  %    HC/AC:      1.03        0.96 - 1.17  AC:      293.1  mm     G. Age:  33w 2d         81  %    FL/BPD:     75.6   %    71 - 87  FL:       62.7  mm     G. Age:  32w 3d         46  %    FL/AC:      21.4   %    20 - 24  Est. FW:    2114  gm    4 lb 11 oz      70  % ---------------------------------------------------------------------- OB History  Gravidity:    5         Term:   1        Prem:   1        SAB:   2  TOP:          0  Ectopic:  0        Living: 2 ----------------------------------------------------------------------  Gestational Age  LMP:           32w 1d        Date:  07/30/21                  EDD:   05/06/22  U/S Today:     33w 1d                                        EDD:   04/29/22  Best:          32w 1d     Det. By:  LMP  (07/30/21)          EDD:   05/06/22 ---------------------------------------------------------------------- Anatomy  Cranium:               Appears normal         LVOT:                   Previously seen  Cavum:                 Previously             Aortic Arch:            Previously seen                         visualized  Ventricles:            Previously seen        Ductal Arch:            Not well visualized  Choroid Plexus:        Previously seen        Diaphragm:              Appears normal  Cerebellum:            Previously seen        Stomach:                Appears normal, left                                                                        sided  Posterior Fossa:       Previously seen        Abdomen:                Appears normal  Nuchal Fold:           Previously seen        Abdominal Wall:         Previously seen  Face:                  Orbits previously      Cord Vessels:           Previously seen                         seen  Lips:  Previously seen        Kidneys:                Previously seen  Palate:                Not well visualized    Bladder:                Appears normal  Thoracic:              Previously seen        Spine:                  Previously seen  Heart:                 Not well visualized    Upper Extremities:      Previously seen  RVOT:                  Previously seen        Lower Extremities:      Previously seen  Other:  Female gender previously seen. Technically difficult due to maternal          habitus and advanced GA. ---------------------------------------------------------------------- Targeted Anatomy  Central Nervous System  Midline Falx:          Appears normal  Head/Neck  Nasal Bone:            Not well visualized    Mandible:                Not well visualized  Profile:               Not well visualized    Maxilla:                Not well visualized  Orbits/Eyes:           Seen on prior scan  Thorax  SVC:                   Seen on prior scan     3 V Trachea View:       Seen on prior scan  3 Vessel View:         Seen on prior scan     IVC:                    Seen on prior scan  Extremities  Lt Hand:               Seen on prior scan     Lt Foot:                Seen on prior scan  Rt Hand:               Open hand nml          Rt Foot:                Seen on prior scan ---------------------------------------------------------------------- Comments  The patient is here for a follow-up BPP and growth ultrasound  at 32w 1d for elevated BMI, hx of preE, AMA. EDD:  05/06/2022 dated by LMP  (07/30/21). She has no concerns  today.  Sonographic findings  Single intrauterine pregnancy.  Fetal cardiac activity: Observed.  Presentation: Cephalic.  Interval fetal anatomy appears normal.  Fetal biometry shows the estimated fetal weight at the 70  percentile.  Amniotic fluid volume: Within normal limits. AFI: 13.54 cm.  MVP: 6.26 cm.  Placenta: Anterior.  BPP: .  Recommendations  1. BBPs weekly until delivery  2. Growth ultrasounds every 4 weeks until delivery  3. Delivery around [redacted] weeks gestation ----------------------------------------------------------------------                 Valeda Malm, DO Electronically Signed Final Report   03/12/2022 01:01 pm ----------------------------------------------------------------------   All questions were answered. The patient knows to call the clinic with any problems, questions or concerns. I spent 5 minutes in the care of this patient including H and P, review of records, counseling and coordination of care.  I connected with  Kimberly Joseph on 03/27/22 by a telephone application and verified that I am speaking with the correct person using two identifiers.   I discussed the limitations of evaluation and management by  telemedicine. The patient expressed understanding and agreed to proceed.     Benay Pike, MD 03/27/2022 1:52 PM

## 2022-04-01 DIAGNOSIS — Z3483 Encounter for supervision of other normal pregnancy, third trimester: Secondary | ICD-10-CM | POA: Diagnosis not present

## 2022-04-01 DIAGNOSIS — Z3490 Encounter for supervision of normal pregnancy, unspecified, unspecified trimester: Secondary | ICD-10-CM | POA: Diagnosis not present

## 2022-04-01 LAB — OB RESULTS CONSOLE GBS: GBS: POSITIVE

## 2022-04-02 ENCOUNTER — Ambulatory Visit: Payer: Medicaid Other | Admitting: *Deleted

## 2022-04-02 ENCOUNTER — Encounter: Payer: Self-pay | Admitting: *Deleted

## 2022-04-02 ENCOUNTER — Ambulatory Visit: Payer: Medicaid Other | Attending: Maternal & Fetal Medicine

## 2022-04-02 DIAGNOSIS — Z3A35 35 weeks gestation of pregnancy: Secondary | ICD-10-CM

## 2022-04-02 DIAGNOSIS — O09893 Supervision of other high risk pregnancies, third trimester: Secondary | ICD-10-CM

## 2022-04-02 DIAGNOSIS — O09523 Supervision of elderly multigravida, third trimester: Secondary | ICD-10-CM | POA: Diagnosis not present

## 2022-04-02 DIAGNOSIS — E669 Obesity, unspecified: Secondary | ICD-10-CM

## 2022-04-02 DIAGNOSIS — O09213 Supervision of pregnancy with history of pre-term labor, third trimester: Secondary | ICD-10-CM

## 2022-04-02 DIAGNOSIS — O09293 Supervision of pregnancy with other poor reproductive or obstetric history, third trimester: Secondary | ICD-10-CM

## 2022-04-02 DIAGNOSIS — O99213 Obesity complicating pregnancy, third trimester: Secondary | ICD-10-CM | POA: Diagnosis not present

## 2022-04-02 DIAGNOSIS — Z8632 Personal history of gestational diabetes: Secondary | ICD-10-CM

## 2022-04-02 DIAGNOSIS — O133 Gestational [pregnancy-induced] hypertension without significant proteinuria, third trimester: Secondary | ICD-10-CM

## 2022-04-08 DIAGNOSIS — Z3483 Encounter for supervision of other normal pregnancy, third trimester: Secondary | ICD-10-CM | POA: Diagnosis not present

## 2022-04-08 DIAGNOSIS — R030 Elevated blood-pressure reading, without diagnosis of hypertension: Secondary | ICD-10-CM | POA: Diagnosis not present

## 2022-04-09 ENCOUNTER — Ambulatory Visit: Payer: Medicaid Other | Attending: Maternal & Fetal Medicine

## 2022-04-09 ENCOUNTER — Ambulatory Visit: Payer: Medicaid Other

## 2022-04-09 VITALS — BP 104/62 | HR 108

## 2022-04-09 DIAGNOSIS — Z3A36 36 weeks gestation of pregnancy: Secondary | ICD-10-CM | POA: Diagnosis not present

## 2022-04-09 DIAGNOSIS — O09893 Supervision of other high risk pregnancies, third trimester: Secondary | ICD-10-CM | POA: Insufficient documentation

## 2022-04-09 DIAGNOSIS — O09523 Supervision of elderly multigravida, third trimester: Secondary | ICD-10-CM | POA: Diagnosis not present

## 2022-04-09 DIAGNOSIS — O99213 Obesity complicating pregnancy, third trimester: Secondary | ICD-10-CM | POA: Diagnosis not present

## 2022-04-09 DIAGNOSIS — Z8632 Personal history of gestational diabetes: Secondary | ICD-10-CM | POA: Diagnosis not present

## 2022-04-09 DIAGNOSIS — O09293 Supervision of pregnancy with other poor reproductive or obstetric history, third trimester: Secondary | ICD-10-CM | POA: Diagnosis not present

## 2022-04-09 DIAGNOSIS — E669 Obesity, unspecified: Secondary | ICD-10-CM | POA: Diagnosis not present

## 2022-04-09 DIAGNOSIS — O133 Gestational [pregnancy-induced] hypertension without significant proteinuria, third trimester: Secondary | ICD-10-CM | POA: Insufficient documentation

## 2022-04-09 DIAGNOSIS — O09213 Supervision of pregnancy with history of pre-term labor, third trimester: Secondary | ICD-10-CM

## 2022-04-15 ENCOUNTER — Telehealth (HOSPITAL_COMMUNITY): Payer: Self-pay | Admitting: *Deleted

## 2022-04-15 ENCOUNTER — Ambulatory Visit: Payer: Medicaid Other | Attending: Obstetrics | Admitting: *Deleted

## 2022-04-15 ENCOUNTER — Encounter (HOSPITAL_COMMUNITY): Payer: Self-pay | Admitting: *Deleted

## 2022-04-15 ENCOUNTER — Ambulatory Visit: Payer: Medicaid Other | Admitting: *Deleted

## 2022-04-15 VITALS — BP 125/71 | HR 102

## 2022-04-15 DIAGNOSIS — O99213 Obesity complicating pregnancy, third trimester: Secondary | ICD-10-CM

## 2022-04-15 DIAGNOSIS — Z3A37 37 weeks gestation of pregnancy: Secondary | ICD-10-CM | POA: Diagnosis not present

## 2022-04-15 DIAGNOSIS — E669 Obesity, unspecified: Secondary | ICD-10-CM | POA: Insufficient documentation

## 2022-04-15 DIAGNOSIS — O133 Gestational [pregnancy-induced] hypertension without significant proteinuria, third trimester: Secondary | ICD-10-CM | POA: Diagnosis not present

## 2022-04-15 DIAGNOSIS — O09523 Supervision of elderly multigravida, third trimester: Secondary | ICD-10-CM

## 2022-04-15 NOTE — Procedures (Signed)
Kimberly Joseph [birth year] [redacted]w[redacted]d Fetus A Non-Stress Test Interpretation for 04/15/22  Indication: Advanced Maternal Age >40 years and GHTN, obese  Fetal Heart Rate A Mode: External Baseline Rate (A): 140 bpm Variability: Moderate Accelerations: 15 x 15 Decelerations: None  Uterine Activity Mode: Toco Contraction Frequency (min): none  Interpretation (Fetal Testing) Nonstress Test Interpretation: Reactive Overall Impression: Reassuring for gestational age Comments: Tracing reviewed by Dr. SDonalee Citrin

## 2022-04-15 NOTE — Telephone Encounter (Signed)
Preadmission screen  

## 2022-04-18 NOTE — H&P (Addendum)
HPI: 47 y.o. [id number] @ [redacted]w[redacted]d estimated gestational age (as dated by LMP c/w 8 week ultrasound) presents for induction of labor due to suspected gestational HTN, AMA (44), history of preeclampsia, and BMI 60 (induction by MFM recommended between 37-38 weeks).  Patient has not ruled in for gestational HTN officially; however, did have a mild range at a prenatal visit on 04/08/2022.  Preeclampsia labs (04/08/22): CBC: WBC 5.3  Hgb 11.7  Hct 37.2  Plt 290 CMP: Creatinine 0.71  AST 17  ALT 13 LDH: 201 PCR: 146mg /g  Leakage of fluid:  No Vaginal bleeding:  No Contractions:  No Fetal movement:  Yes  Prenatal care has been provided by Dr. Drema [facility]).  ROS:  Denies fevers, chills, chest pain, visual changes, SOB, RUQ/epigastric pain, N/V, dysuria, hematuria, or sudden onset/worsening bilateral LE or facial edema.  Pregnancy complicated by: Suspected gestational HTN Obesity (BMI 60) History of preeclampsia with iatrogenic PTD at 34 weeks AMA (87) Pre-diabetes (normal glucola this pregnancy) Jehovah's Witness (declines blood products) Ptyalism GERD Hyepremesis early this pregnancy  Prenatal Transfer Tool  Maternal Diabetes: No Genetic Screening: Normal - Low risk female Panorama, MSAFP negative Maternal Ultrasounds/Referrals: Normal Fetal Ultrasounds or other Referrals:  Referred to Materal Fetal Medicine  Maternal Substance Abuse:  No Significant Maternal Medications:  None Significant Maternal Lab Results: Group B Strep positive   Prenatal Labs Blood type:  B Positive Antibody screen:  Negative CBC:  H/H 10.7/33.6 Rubella: Immune RPR:  Non-reactive Hep B:  Negative Hep C:  Negative HIV:  Negative GC/CT:  Negative Glucola:  129 (wnl)  Immunizations: Tdap: Given prenatally Flu: Received 11/10/21  OBHx:  OB History     Gravida  5   Para  2   Term  1   Preterm  1   AB  2   Living  2      SAB  1   IAB      Ectopic      Multiple  0    Live Births  2          PMHx:  See above Meds:  PNV, ASA 81mg  daily Allergy:  No Known Allergies SurgHx: None SocHx:   Denies Tobacco, ETOH, illicit drugs  O: LMP [id number] Comment: Informed Consent Form Completed Gen. AAOx3, NAD CV.  RRR  Resp. CTAB, no wheezes/rales/rhonchi Abd. Gravid, soft, non-tender throughout, no rebound/guarding Extr.  Trace bilateral LE edema, no calf tenderness bilaterally SVE (3/12): 0000000, cephalic by sutures    Last [place]:   Narrative & Impression  ----------------------------------------------------------------------  OBSTETRICS REPORT                       (Signed Final 04/09/2022 12:40 pm) ---------------------------------------------------------------------- Patient Info  ID #:       [id number]                          D.O.B.:  Nov 20, 1975 (46 yrs)  Name:       Kimberly Joseph                Visit Date: 04/09/2022 10:47 am ---------------------------------------------------------------------- Performed By  Attending:        Johnell Comings MD         Ref. Address:     [facility]  [address] [place], [place]  Performed By:     Rosana Hoes          [facility]         [facility]                                                             [facility]  Referred By:      Drema Dallas ---------------------------------------------------------------------- Orders  #  Description                           Code        Ordered By  1  [place] MFM OB FOLLOW UP                   [id number]    Sander Nephew  2  [place] MFM FETAL BPP WO NON               [id number]    [place] ----------------------------------------------------------------------  #  Order #  Accession #                Episode #  1  [id number]                   [id number]  2  [id number]                   [id number]                 [id number] ---------------------------------------------------------------------- Indications  Gestational hypertension without significant   O13.3  proteinuria, third trimester  Obesity complicating pregnancy, third          O99.213  trimester (BMI 59)  Advanced maternal age multigravida 13+,        O1.523  third trimester (54 yrs)  Poor obstetric history: Previous preterm       O09.219  delivery, antepartum (34 wks)  Poor obstetric history: Previous gestational   O09.299  diabetes  Poor obstetric history: Previous preeclampsia  O09.299  Encounter for other antenatal screening        Z36.2  follow-up  [redacted] weeks gestation of pregnancy                Z3A.36 ---------------------------------------------------------------------- Vital Signs                                                 Height:        5'6" ---------------------------------------------------------------------- Fetal Evaluation  Num Of Fetuses:         1  Fetal Heart Rate(bpm):  137  Cardiac Activity:       Observed  Presentation:           Cephalic  Placenta:               Anterior  P. Cord Insertion:      Previously visualized  Amniotic Fluid  AFI FV:      Within normal limits  AFI Sum(cm)     %Tile       Largest Pocket(cm)  7.99            7           4.22  RUQ(cm)       RLQ(cm)       LUQ(cm)        LLQ(cm)  4.22          3.77          0              0 ---------------------------------------------------------------------- Biophysical Evaluation  Amniotic F.V:   Within normal limits       F. Tone:        Observed  F. Movement:    Observed                   Score:           8/8  F. Breathing:   Observed ---------------------------------------------------------------------- Biometry  BPD:      86.3  mm     G. Age:  34w 6d         22  %    CI:        76.81   %    70 - 86  FL/HC:      22.0   %    20.1 - 22.1  HC:      311.9  mm     G. Age:  34w 6d        4.6  %    HC/AC:      0.99        0.93 - 1.11  AC:      315.8  mm     G. Age:  35w 4d         42  %    FL/BPD:     79.6   %    71 - 87  FL:       68.7  mm     G. Age:  35w 2d         24  %    FL/AC:      21.8   %    20 - 24  Est. FW:    2645  gm    5 lb 13 oz      29  % ---------------------------------------------------------------------- OB History  Gravidity:    5         Term:   1        Prem:   1        SAB:   2  TOP:          0       Ectopic:  0        Living: 2 ---------------------------------------------------------------------- Gestational Age  LMP:           36w 1d        Date:  07/30/21                  EDD:   05/06/22  U/S Today:     35w 1d                                        EDD:   05/13/22  Best:          36w 1d     Det. By:  LMP  (07/30/21)          EDD:   05/06/22 ---------------------------------------------------------------------- Anatomy  Ventricles:            Appears normal         Stomach:                Appears normal, left                                                                        sided  Heart:                 Previously seen        Kidneys:                Appear normal  Diaphragm:             Previously seen        Bladder:                Appears normal ---------------------------------------------------------------------- Cervix  Uterus Adnexa  Adnexa  No abnormality visualized ---------------------------------------------------------------------- Comments  This patient was seen for a follow up growth scan and BPP  due to maternal obesity with a BMI of over 71 and advanced  maternal age (46 years old).  She  denies any problems since  her last exam.  She was possibly diagnosed with gestational hypertension.  Her blood pressure today was 104/62.  She was informed that the fetal growth and amniotic fluid  level appears appropriate for her gestational age.  A BPP performed today was 8 out of 8.  Due to gestational hypertension, maternal obesity, and her  age, delivery is recommended at between 20 to 15 weeks.  She will return to our office in 1 week for an NST should she  remain undelivered at that time. ----------------------------------------------------------------------                  Johnell Comings, MD Electronically Signed Final Report   04/09/2022 12:40 pm ----------------------------------------------------------------------   Labs: see orders  A/P:  47 y.o. [id number] @ [redacted]w[redacted]d who is admitted for induction of labor for suspected gestational HTN, AMA (2), history of preeclampsia, and BMI 60 (induction by MFM recommended between 37-38 weeks).  - Admit to L&D - Admit labs (CBC, T&S, CMP, LDH, PCR,RPR, and COVID screen per protocol) - CEFM/Toco - Diet:  Clear liquids - IVF:  LR at 125cc/hour - VTE Prophylaxis:  SCDs - GBS Status:  Positive -- PCN ordered - Presentation:  Confirm prior to start of IOL - Pain control:  Per patient request - Induction method:  Cytotec for cervical ripening - Anticipate SVD  Drema Dallas, DO

## 2022-04-21 ENCOUNTER — Inpatient Hospital Stay (HOSPITAL_COMMUNITY): Payer: Medicaid Other

## 2022-04-21 ENCOUNTER — Encounter (HOSPITAL_COMMUNITY): Payer: Self-pay | Admitting: Obstetrics and Gynecology

## 2022-04-21 ENCOUNTER — Inpatient Hospital Stay (HOSPITAL_COMMUNITY)
Admission: RE | Admit: 2022-04-21 | Discharge: 2022-04-22 | DRG: 807 | Disposition: A | Discharge status: Home / Self Care | Payer: Medicaid Other | Attending: Obstetrics and Gynecology | Admitting: Obstetrics and Gynecology

## 2022-04-21 ENCOUNTER — Other Ambulatory Visit: Payer: Self-pay

## 2022-04-21 DIAGNOSIS — O99214 Obesity complicating childbirth: Secondary | ICD-10-CM | POA: Diagnosis present

## 2022-04-21 DIAGNOSIS — O133 Gestational [pregnancy-induced] hypertension without significant proteinuria, third trimester: Principal | ICD-10-CM

## 2022-04-21 DIAGNOSIS — Z3A37 37 weeks gestation of pregnancy: Secondary | ICD-10-CM

## 2022-04-21 DIAGNOSIS — Z7982 Long term (current) use of aspirin: Secondary | ICD-10-CM

## 2022-04-21 DIAGNOSIS — O326XX0 Maternal care for compound presentation, not applicable or unspecified: Secondary | ICD-10-CM | POA: Diagnosis present

## 2022-04-21 DIAGNOSIS — R7303 Prediabetes: Secondary | ICD-10-CM | POA: Diagnosis not present

## 2022-04-21 DIAGNOSIS — O411230 Chorioamnionitis, third trimester, not applicable or unspecified: Secondary | ICD-10-CM | POA: Diagnosis not present

## 2022-04-21 DIAGNOSIS — O1404 Mild to moderate pre-eclampsia, complicating childbirth: Secondary | ICD-10-CM | POA: Diagnosis not present

## 2022-04-21 DIAGNOSIS — O139 Gestational [pregnancy-induced] hypertension without significant proteinuria, unspecified trimester: Secondary | ICD-10-CM | POA: Diagnosis present

## 2022-04-21 DIAGNOSIS — O99824 Streptococcus B carrier state complicating childbirth: Secondary | ICD-10-CM | POA: Diagnosis present

## 2022-04-21 DIAGNOSIS — O134 Gestational [pregnancy-induced] hypertension without significant proteinuria, complicating childbirth: Secondary | ICD-10-CM | POA: Diagnosis not present

## 2022-04-21 LAB — CBC
HCT: 39.2 % (ref 36.0–46.0)
Hemoglobin: 13 g/dL (ref 12.0–15.0)
MCH: 26.2 pg (ref 26.0–34.0)
MCHC: 33.2 g/dL (ref 30.0–36.0)
MCV: 78.9 fL — ABNORMAL LOW (ref 80.0–100.0)
Platelets: 290 10*3/uL (ref 150–400)
RBC: 4.97 MIL/uL (ref 3.87–5.11)
RDW: 18.9 % — ABNORMAL HIGH (ref 11.5–15.5)
WBC: 7.2 10*3/uL (ref 4.0–10.5)
nRBC: 0 % (ref 0.0–0.2)

## 2022-04-21 LAB — COMPREHENSIVE METABOLIC PANEL
ALT: 18 U/L (ref 0–44)
AST: 32 U/L (ref 15–41)
Albumin: 2.7 g/dL — ABNORMAL LOW (ref 3.5–5.0)
Alkaline Phosphatase: 156 U/L — ABNORMAL HIGH (ref 38–126)
Anion gap: 11 (ref 5–15)
BUN: <5 mg/dL — ABNORMAL LOW (ref 6–20)
CO2: 22 mmol/L (ref 22–32)
Calcium: 9.3 mg/dL (ref 8.9–10.3)
Chloride: 101 mmol/L (ref 98–111)
Creatinine, Ser: 0.75 mg/dL (ref 0.44–1.00)
GFR, Estimated: >60 mL/min (ref >60)
Glucose, Bld: 105 mg/dL — ABNORMAL HIGH (ref 70–99)
Potassium: 3.8 mmol/L (ref 3.5–5.1)
Sodium: 134 mmol/L — ABNORMAL LOW (ref 135–145)
Total Bilirubin: 0.7 mg/dL (ref 0.3–1.2)
Total Protein: 6.5 g/dL (ref 6.5–8.1)

## 2022-04-21 LAB — HIV ANTIBODY (ROUTINE TESTING W REFLEX): HIV Screen 4th Generation wRfx: NONREACTIVE

## 2022-04-21 LAB — PROTEIN / CREATININE RATIO, URINE
Creatinine, Urine: 31 mg/dL
Total Protein, Urine: <6 mg/dL

## 2022-04-21 LAB — NO BLOOD PRODUCTS

## 2022-04-21 LAB — BLOOD PRODUCT ORDER (VERBAL) VERIFICATION

## 2022-04-21 LAB — RPR: RPR Ser Ql: NONREACTIVE

## 2022-04-21 LAB — ABO/RH: ABO/RH(D): B POS

## 2022-04-21 LAB — LACTATE DEHYDROGENASE: LDH: 266 U/L — ABNORMAL HIGH (ref 98–192)

## 2022-04-21 LAB — ANTIBODY SCREEN: Antibody Screen: NEGATIVE

## 2022-04-21 MED ORDER — DIPHENHYDRAMINE HCL 25 MG PO CAPS: 25.0000 mg | ORAL_CAPSULE | Freq: Four times a day (QID) | ORAL | Status: DC | PRN

## 2022-04-21 MED ORDER — WITCH HAZEL-GLYCERIN EX PADS: 1.0000 | MEDICATED_PAD | CUTANEOUS | Status: DC | PRN

## 2022-04-21 MED ORDER — IBUPROFEN 600 MG PO TABS
600.0000 mg | ORAL_TABLET | Freq: Four times a day (QID) | ORAL | Status: DC
  Administered 2022-04-21 – 2022-04-22 (×4): 600 mg via ORAL
  Filled 2022-04-21 (×4): qty 1

## 2022-04-21 MED ORDER — SOD CITRATE-CITRIC ACID 500-334 MG/5ML PO SOLN: 30.0000 mL | ORAL | Status: DC | PRN

## 2022-04-21 MED ORDER — OXYCODONE-ACETAMINOPHEN 5-325 MG PO TABS: 2.0000 | ORAL_TABLET | ORAL | Status: DC | PRN

## 2022-04-21 MED ORDER — ONDANSETRON HCL 4 MG/2ML IJ SOLN: 4.0000 mg | INTRAMUSCULAR | Status: DC | PRN

## 2022-04-21 MED ORDER — HYDRALAZINE HCL 20 MG/ML IJ SOLN: 10.0000 mg | INTRAMUSCULAR | Status: DC | PRN

## 2022-04-21 MED ORDER — LABETALOL HCL 5 MG/ML IV SOLN: 20.0000 mg | INTRAVENOUS | Status: DC | PRN

## 2022-04-21 MED ORDER — NIFEDIPINE ER OSMOTIC RELEASE 30 MG PO TB24
30.0000 mg | ORAL_TABLET | Freq: Every day | ORAL | Status: DC
  Administered 2022-04-21 – 2022-04-22 (×2): 30 mg via ORAL
  Filled 2022-04-21 (×2): qty 1

## 2022-04-21 MED ORDER — SODIUM CHLORIDE 0.9 % IV SOLN
5.0000 10*6.[IU] | Freq: Once | INTRAVENOUS | Status: AC
  Administered 2022-04-21: 5 10*6.[IU] via INTRAVENOUS
  Filled 2022-04-21: qty 5

## 2022-04-21 MED ORDER — OXYCODONE-ACETAMINOPHEN 5-325 MG PO TABS: 1.0000 | ORAL_TABLET | ORAL | Status: DC | PRN

## 2022-04-21 MED ORDER — LABETALOL HCL 5 MG/ML IV SOLN: 40.0000 mg | INTRAVENOUS | Status: DC | PRN

## 2022-04-21 MED ORDER — LABETALOL HCL 5 MG/ML IV SOLN: 80.0000 mg | INTRAVENOUS | Status: DC | PRN

## 2022-04-21 MED ORDER — OXYCODONE HCL 5 MG PO TABS: 10.0000 mg | ORAL_TABLET | Freq: Four times a day (QID) | ORAL | Status: DC | PRN

## 2022-04-21 MED ORDER — ACETAMINOPHEN 325 MG PO TABS: 650.0000 mg | ORAL_TABLET | ORAL | Status: DC | PRN

## 2022-04-21 MED ORDER — OXYTOCIN-SODIUM CHLORIDE 30-0.9 UT/500ML-% IV SOLN
2.5000 [IU]/h | INTRAVENOUS | Status: DC
  Filled 2022-04-21: qty 500

## 2022-04-21 MED ORDER — LACTATED RINGERS IV SOLN: INTRAVENOUS | Status: DC

## 2022-04-21 MED ORDER — LIDOCAINE HCL (PF) 1 % IJ SOLN
30.0000 mL | INTRAMUSCULAR | Status: AC | PRN
  Administered 2022-04-21: 30 mL via SUBCUTANEOUS
  Filled 2022-04-21: qty 30

## 2022-04-21 MED ORDER — SENNOSIDES-DOCUSATE SODIUM 8.6-50 MG PO TABS
2.0000 | ORAL_TABLET | Freq: Every day | ORAL | Status: DC
  Administered 2022-04-22: 2 via ORAL
  Filled 2022-04-21: qty 2

## 2022-04-21 MED ORDER — DIBUCAINE (PERIANAL) 1 % EX OINT: 1.0000 | TOPICAL_OINTMENT | CUTANEOUS | Status: DC | PRN

## 2022-04-21 MED ORDER — ZOLPIDEM TARTRATE 5 MG PO TABS: 5.0000 mg | ORAL_TABLET | Freq: Every evening | ORAL | Status: DC | PRN

## 2022-04-21 MED ORDER — ONDANSETRON HCL 4 MG PO TABS: 4.0000 mg | ORAL_TABLET | ORAL | Status: DC | PRN

## 2022-04-21 MED ORDER — FENTANYL CITRATE (PF) 100 MCG/2ML IJ SOLN: 50.0000 ug | INTRAMUSCULAR | Status: DC | PRN

## 2022-04-21 MED ORDER — OXYTOCIN-SODIUM CHLORIDE 30-0.9 UT/500ML-% IV SOLN: 1.0000 m[IU]/min | INTRAVENOUS | Status: DC

## 2022-04-21 MED ORDER — TRANEXAMIC ACID-NACL 1000-0.7 MG/100ML-% IV SOLN
INTRAVENOUS | Status: AC
  Administered 2022-04-21: 1000 mg
  Filled 2022-04-21: qty 100

## 2022-04-21 MED ORDER — OXYCODONE HCL 5 MG PO TABS: 5.0000 mg | ORAL_TABLET | Freq: Four times a day (QID) | ORAL | Status: DC | PRN

## 2022-04-21 MED ORDER — COCONUT OIL OIL: 1.0000 | TOPICAL_OIL | Status: DC | PRN

## 2022-04-21 MED ORDER — PENICILLIN G POT IN DEXTROSE 60000 UNIT/ML IV SOLN
3.0000 10*6.[IU] | INTRAVENOUS | Status: DC
  Administered 2022-04-21 (×2): 3 10*6.[IU] via INTRAVENOUS
  Filled 2022-04-21 (×2): qty 50

## 2022-04-21 MED ORDER — LACTATED RINGERS IV SOLN: 500.0000 mL | INTRAVENOUS | Status: DC | PRN

## 2022-04-21 MED ORDER — ONDANSETRON HCL 4 MG/2ML IJ SOLN: 4.0000 mg | Freq: Four times a day (QID) | INTRAMUSCULAR | Status: DC | PRN

## 2022-04-21 MED ORDER — PRENATAL MULTIVITAMIN CH
1.0000 | ORAL_TABLET | Freq: Every day | ORAL | Status: DC
  Administered 2022-04-22: 1 via ORAL
  Filled 2022-04-21: qty 1

## 2022-04-21 MED ORDER — SIMETHICONE 80 MG PO CHEW: 80.0000 mg | CHEWABLE_TABLET | ORAL | Status: DC | PRN

## 2022-04-21 MED ORDER — OXYTOCIN BOLUS FROM INFUSION
333.0000 mL | Freq: Once | INTRAVENOUS | Status: DC
  Administered 2022-04-21: 333 mL via INTRAVENOUS

## 2022-04-21 MED ORDER — HYDROXYZINE HCL 50 MG PO TABS: 50.0000 mg | ORAL_TABLET | Freq: Four times a day (QID) | ORAL | Status: DC | PRN

## 2022-04-21 MED ORDER — MISOPROSTOL 25 MCG QUARTER TABLET
25.0000 ug | ORAL_TABLET | ORAL | Status: DC | PRN
  Administered 2022-04-21 (×2): 25 ug via VAGINAL
  Filled 2022-04-21 (×3): qty 1

## 2022-04-21 MED ORDER — BENZOCAINE-MENTHOL 20-0.5 % EX AERO
1.0000 | INHALATION_SPRAY | CUTANEOUS | Status: DC | PRN
  Administered 2022-04-22: 1 via TOPICAL
  Filled 2022-04-21 (×2): qty 56

## 2022-04-21 MED ORDER — TERBUTALINE SULFATE 1 MG/ML IJ SOLN: 0.2500 mg | Freq: Once | INTRAMUSCULAR | Status: DC | PRN

## 2022-04-21 NOTE — Progress Notes (Signed)
OB Progress Note  S: Patient resting comfortably. Pain score is 0.  O: BP 120/70   Pulse 89   Temp 98.7 F (37.1 C) (Oral)   Resp 16   Ht 5\' 6"  (1.676 m)   Wt (!) 161.7 kg   LMP 07/30/2021 Comment: Informed Consent Form Completed  BMI 57.54 kg/m   FHT: 135bpm, moderate variablity, + accels, - decels Toco: occasional SVE: 1.5/50/-3 by primary RN  A/P: 47 y.o. [id number] @ [redacted]w[redacted]d admitted for induction of labor for gestational HTN.  FWB: Cat. I Labor course: S/p vaginal cytotec 27mcg x 2 doses (last dose just received now) Pain: Per patient request GBS: Positive -- receiving PCN (s/p 2 doses) Blood pressures normal to mild range Preeclampsia labs unremarkable, PCR in process Jehovah's witness - declines blood products, starting Hgb 13.0 Anticipate SVD  Drema Dallas, DO

## 2022-04-21 NOTE — Lactation Note (Signed)
This note was copied from a baby's chart. Lactation Consultation Note  Patient Name: Girl Mireya Wint [id number] Date: 04/21/2022 Age:45 hours Reason for consult: Initial assessment;Early term 37-38.6wks Experienced BF mom had baby on the breast BF great. Mom worried that baby wasn't getting anything. Newborn feeding habits, STS, I&O reviewed. Baby had good jaw extension and compressions of breast. Mom stated how will she know if the baby is getting anything. Reviewed this w/mom. Mom encouraged to feed baby 8-12 times/24 hours and with feeding cues.  Encouraged mom to call for assistance as needed. Praised mom for good feeding and body alignment. Maternal Data Has patient been taught Hand Expression?: Yes Does the patient have breastfeeding experience prior to this delivery?: Yes How long did the patient breastfeed?: BF her now 47 yr old for 2 yrs and her now 47 yr old for 8 months  Feeding    LATCH Score Latch: Grasps breast easily, tongue down, lips flanged, rhythmical sucking.  Audible Swallowing: A few with stimulation  Type of Nipple: Everted at rest and after stimulation  Comfort (Breast/Nipple): Soft / non-tender  Hold (Positioning): No assistance needed to correctly position infant at breast.  LATCH Score: 9   Lactation Tools Discussed/Used    Interventions Interventions: Breast feeding basics reviewed;LC Services brochure  Discharge    Consult Status Consult Status: Follow-up Date: 04/22/22 Follow-up type: In-patient    Theodoro Kalata 04/21/2022, 8:16 PM

## 2022-04-22 ENCOUNTER — Other Ambulatory Visit (HOSPITAL_COMMUNITY): Payer: Self-pay

## 2022-04-22 LAB — CBC
HCT: 33.3 % — ABNORMAL LOW (ref 36.0–46.0)
Hemoglobin: 11.2 g/dL — ABNORMAL LOW (ref 12.0–15.0)
MCH: 26.4 pg (ref 26.0–34.0)
MCHC: 33.6 g/dL (ref 30.0–36.0)
MCV: 78.4 fL — ABNORMAL LOW (ref 80.0–100.0)
Platelets: 248 10*3/uL (ref 150–400)
RBC: 4.25 MIL/uL (ref 3.87–5.11)
RDW: 18.4 % — ABNORMAL HIGH (ref 11.5–15.5)
WBC: 8.9 10*3/uL (ref 4.0–10.5)
nRBC: 0 % (ref 0.0–0.2)

## 2022-04-22 MED ORDER — NIFEDIPINE ER 30 MG PO TB24
30.0000 mg | ORAL_TABLET | Freq: Every day | ORAL | 3 refills | Status: DC
  Filled 2022-04-22: qty 30, 30d supply, fill #0

## 2022-04-22 MED ORDER — IBUPROFEN 600 MG PO TABS
600.0000 mg | ORAL_TABLET | Freq: Four times a day (QID) | ORAL | 1 refills | Status: AC | PRN
  Filled 2022-04-22: qty 30, 8d supply, fill #0

## 2022-04-22 NOTE — Discharge Summary (Signed)
Postpartum Discharge Summary  Date of Service: 04/22/22      Patient Name: Kimberly Joseph DOB: 21-Sep-1975 MRN: [id number]  Date of admission: 04/21/2022 Delivery date:04/21/2022  Delivering provider: Drema Dallas  Date of discharge: 04/22/2022  Admitting diagnosis: Gestational hypertension [O13.9] Intrauterine pregnancy: [redacted]w[redacted]d     Secondary diagnosis:  Preeclampsia without severe features Additional problems: Obesity (BMI 14), Pre-diabetes, History of preeclampsia with iatrogenic PTD at 35 weeks, AMA (7), Jehovah's Witness (declines blood products), Ptyalism, GERD, Hyperemesis during early pregnancy Discharge diagnosis: Term Pregnancy Delivered, Gestational Hypertension, and Preeclampsia (mild)                                              Post partum procedures: None Augmentation: Cytotec Complications: None  Hospital course: Induction of Labor With Vaginal Delivery   47 y.o. yo [id number] at [redacted]w[redacted]d was admitted to the hospital 04/21/2022 for induction of labor.  Indication for induction: Gestational hypertension (ruled in for preeclampsia without severe features based on PCR of 0.26).  Patient had an uncomplicated labor course. Membrane Rupture Time/Date: 12:46 PM ,04/21/2022   Delivery Method:Vaginal, Spontaneous  Episiotomy: None  Lacerations:  2nd degree;Periurethral  Details of delivery can be found in separate delivery note.  Patient had a relatively uncomplicated labor course. Her blood pressures were mild range and she was started on Procardia XL 30mg  daily and was normotensive after initiation of this medication. She was asymptomatic for signs/symptoms of preeclampsia. Patient is discharged home 04/22/22.  Newborn Data: Birth date:04/21/2022  Birth time:12:53 PM  Gender:Female  Living status:Living  Apgars:8 ,9  Weight:2820 g   Magnesium Sulfate received: No BMZ received: No Rhophylac:N/A MMR:N/A T-DaP:Given prenatally Flu: Yes Transfusion:No  Physical exam   Vitals:   04/21/22 1559 04/21/22 1823 04/21/22 2129 04/22/22 0410  BP: (!) 137/92 130/82 120/70 126/86  Pulse: 100  (!) 108 87  Resp: 18  18 20   Temp: 97.9 F (36.6 C)  98.2 F (36.8 C) 97.9 F (36.6 C)  TempSrc: Oral  Oral Oral  SpO2: 100%  100% 100%  Weight:      Height:       General: alert, cooperative, and no distress Lochia: appropriate Uterine Fundus: firm Incision: N/A DVT Evaluation: No evidence of DVT seen on physical exam. No cords or calf tenderness. Labs: Lab Results  Component Value Date   WBC 8.9 04/22/2022   HGB 11.2 (L) 04/22/2022   HCT 33.3 (L) 04/22/2022   MCV 78.4 (L) 04/22/2022   PLT 248 04/22/2022      Latest Ref Rng & Units 04/21/2022    2:11 AM  CMP  Glucose 70 - 99 mg/dL 105   BUN 6 - 20 mg/dL <5   Creatinine 0.44 - 1.00 mg/dL 0.75   Sodium 135 - 145 mmol/L 134   Potassium 3.5 - 5.1 mmol/L 3.8   Chloride 98 - 111 mmol/L 101   CO2 22 - 32 mmol/L 22   Calcium 8.9 - 10.3 mg/dL 9.3   Total Protein 6.5 - 8.1 g/dL 6.5   Total Bilirubin 0.3 - 1.2 mg/dL 0.7   Alkaline Phos 38 - 126 U/L 156   AST 15 - 41 U/L 32   ALT 0 - 44 U/L 18    Edinburgh Score:     No data to display            After visit  meds:  Allergies as of 04/22/2022   No Known Allergies      Medication List     STOP taking these medications    Accu-Chek FastClix Lancets Misc   CitraNatal 90 DHA 90-1 & 300 MG Misc   glycopyrrolate 1 MG tablet Commonly known as: Robinul   metoCLOPramide 10 MG tablet Commonly known as: Reglan   omeprazole 20 MG capsule Commonly known as: PRILOSEC   OneTouch Delica Plus 0000000 Misc   OneTouch Verio Reflect w/Device Kit   OneTouch Verio test strip Generic drug: glucose blood   promethazine 25 MG tablet Commonly known as: PHENERGAN   tiZANidine 4 MG capsule Commonly known as: Zanaflex   Wegovy 0.25 MG/0.5ML Soaj Generic drug: Semaglutide-Weight Management       TAKE these medications    famotidine 40 MG  tablet Commonly known as: Pepcid Take 1 tablet (40 mg total) by mouth at bedtime.   ibuprofen 600 MG tablet Commonly known as: ADVIL Take 1 tablet (600 mg total) by mouth every 6 (six) hours as needed for mild pain, moderate pain or cramping.   NIFEdipine 30 MG 24 hr tablet Commonly known as: ADALAT CC Take 1 tablet (30 mg total) by mouth daily.   ondansetron 8 MG disintegrating tablet Commonly known as: ZOFRAN-ODT Dissolve 1 tablet (8 mg total) by mouth every 8 (eight) hours as needed.   PNV PO Take 1 tablet by mouth daily.         Discharge home in stable condition Infant Feeding: Breast Infant Disposition:home with mother Discharge instruction: per After Visit Summary and Postpartum booklet. Activity: Advance as tolerated. Pelvic rest for 6 weeks.  Diet: routine diet Anticipated Birth Control: Unsure Postpartum Appointment:6 weeks Additional Postpartum F/U: BP check 1 week Future Appointments:No future appointments. Follow up Visit:  Follow-up Information     Drema Dallas, DO Follow up in 1 week(s).   Specialty: Obstetrics and Gynecology Why: Our office will arrange a 1 week blood pressure check and your 6 week postpartum visit. Contact information: [address] [place] [address] [phone]                     04/22/2022 Drema Dallas, DO

## 2022-04-22 NOTE — Progress Notes (Signed)
Postpartum Note Day #1  S:  Patient doing well.  Pain controlled.  Tolerating regular diet.   Ambulating and voiding without difficulty.  Desires discharge home today if infant is discharged. Denies fevers, chills, chest pain, SOB, N/V, or worsening bilateral LE edema.  Lochia: Minimal Infant feeding:  Breast Circumcision:  N/A, female infant Contraception:  Undecided  O: Temp:  [97.9 F (36.6 C)-98.2 F (36.8 C)] 97.9 F (36.6 C) (03/20 0410) Pulse Rate:  [87-140] 87 (03/20 0410) Resp:  [18-20] 20 (03/20 0410) BP: (110-149)/(70-102) 126/86 (03/20 0410) SpO2:  [99 %-100 %] 100 % (03/20 0410) Gen: NAD, pleasant and cooperative CV: Regular rate Resp: Normal work of breathing Abdomen: soft, non-distended, non-tender throughout Uterus: firm, non-tender, below umbilicus Ext: Trace bilateral LE edema, no bilateral calf tenderness  Labs:  Recent Labs    04/21/22 0211 04/22/22 0523  HGB 13.0 11.2*  HCT 39.2 33.3*    A/P: Patient is a 47 y.o. [id number] PPD#1 s/p SVD.  S/p SVD - Pain well controlled  - GU: UOP is adequate - GI: Tolerating regular diet - Activity: encouraged sitting up to chair and ambulation as tolerated - DVT Prophylaxis: Ambulation, SCDs in bed - Labs: as above  Preeclampsia without SF - Technically based on PCR of 0.26 - Blood pressures normotensive since starting Procardia XL 30mg  daily - Patient is asymptomatic - 1 week BP check arranged with the office  Disposition:  D/C home today if infant is discharged; otherwise, D/C home tomorrow.   Drema Dallas, DO

## 2022-04-23 LAB — SURGICAL PATHOLOGY

## 2022-05-04 ENCOUNTER — Telehealth (HOSPITAL_COMMUNITY): Payer: Self-pay | Admitting: *Deleted

## 2022-05-04 NOTE — Telephone Encounter (Signed)
Patient voiced no questions or concerns regarding her health at this time. EPDS=0. Patient voiced no questions or concerns regarding infant at this time. RN reviewed ABCs of safe sleep. Patient verbalized understanding. Patient requested RN email information on hospital's virtual postpartum classes and support groups. Email sent. Erline Levine, RN, 05/04/22, [phone]

## 2022-05-06 ENCOUNTER — Other Ambulatory Visit (HOSPITAL_COMMUNITY): Payer: Self-pay

## 2022-08-20 ENCOUNTER — Telehealth: Payer: Medicaid Other

## 2022-08-25 ENCOUNTER — Ambulatory Visit: Payer: Medicaid Other | Admitting: Family Medicine

## 2022-08-25 ENCOUNTER — Ambulatory Visit (INDEPENDENT_AMBULATORY_CARE_PROVIDER_SITE_OTHER): Payer: Medicaid Other

## 2022-08-25 ENCOUNTER — Encounter: Payer: Self-pay | Admitting: Family Medicine

## 2022-08-25 ENCOUNTER — Other Ambulatory Visit: Payer: Self-pay

## 2022-08-25 VITALS — BP 118/84 | HR 96 | Ht 66.0 in | Wt 353.0 lb

## 2022-08-25 DIAGNOSIS — M170 Bilateral primary osteoarthritis of knee: Secondary | ICD-10-CM

## 2022-08-25 DIAGNOSIS — M1712 Unilateral primary osteoarthritis, left knee: Secondary | ICD-10-CM | POA: Diagnosis not present

## 2022-08-25 DIAGNOSIS — M25562 Pain in left knee: Secondary | ICD-10-CM | POA: Diagnosis not present

## 2022-08-25 DIAGNOSIS — M25561 Pain in right knee: Secondary | ICD-10-CM | POA: Diagnosis not present

## 2022-08-25 DIAGNOSIS — M25462 Effusion, left knee: Secondary | ICD-10-CM | POA: Diagnosis not present

## 2022-08-25 DIAGNOSIS — G8929 Other chronic pain: Secondary | ICD-10-CM

## 2022-08-25 DIAGNOSIS — M1711 Unilateral primary osteoarthritis, right knee: Secondary | ICD-10-CM | POA: Diagnosis not present

## 2022-08-25 NOTE — Progress Notes (Unsigned)
I, Stevenson Clinch, CMA acting as a scribe for Kimberly Graham, MD.  Kimberly Joseph is a 47 y.o. female who presents to [facility] at [facility] today for exacerbation of her bilat knee pain. Pt was last seen by Dr. Denyse Amass on 09/11/21 and was given a L knee steroid injection.  Today, pt reports pain in both knees. Increased mechanical sx in left knee. Some swelling in the right knee. 3 months s/p childbirth.   Knee swelling: right knee  Pertinent review of systems: No fevers or chills  Relevant historical information: Currently breast-feeding 35-month-old infant.  She is supplementing with formula.   Exam:  BP 118/84   Pulse 96   Ht 5\' 6"  (1.676 m)   Wt (!) 353 lb (160.1 kg)   SpO2 98%   BMI 56.98 kg/m  General: Well Developed, well nourished, and in no acute distress.   MSK: Right knee: Minimal effusion normal motion with crepitation. Intact strength.  Left knee: Mild effusion.  Normal motion with crepitation.  Intact strength.   Lab and Radiology Results  Procedure: Real-time Ultrasound Guided Injection of right knee joint superior lateral patella space Device: Philips Affiniti 50G/GE Logiq Images permanently stored and available for review in PACS Verbal informed consent obtained.  Discussed risks and benefits of procedure. Warned about infection, bleeding, hyperglycemia damage to structures among others. Patient expresses understanding and agreement Time-out conducted.   Noted no overlying erythema, induration, or other signs of local infection.   Skin prepped in a sterile fashion.   Local anesthesia: Topical Ethyl chloride.   With sterile technique and under real time ultrasound guidance: 40 mg of Kenalog and 2 mL of Marcaine injected into knee joint. Fluid seen entering the joint capsule.   Completed without difficulty   Pain immediately resolved suggesting accurate placement of the medication.   Advised to call if fevers/chills, erythema,  induration, drainage, or persistent bleeding.   Images permanently stored and available for review in the ultrasound unit.  Impression: Technically successful ultrasound guided injection.   Procedure: Real-time Ultrasound Guided Injection of left knee joint superior lateral patella space Device: Philips Affiniti 50G/GE Logiq Images permanently stored and available for review in PACS Verbal informed consent obtained.  Discussed risks and benefits of procedure. Warned about infection, bleeding, hyperglycemia damage to structures among others. Patient expresses understanding and agreement Time-out conducted.   Noted no overlying erythema, induration, or other signs of local infection.   Skin prepped in a sterile fashion.   Local anesthesia: Topical Ethyl chloride.   With sterile technique and under real time ultrasound guidance: 40 mg of Kenalog and 2 mL of Marcaine injected into knee joint. Fluid seen entering the joint capsule.   Completed without difficulty   Pain immediately resolved suggesting accurate placement of the medication.   Advised to call if fevers/chills, erythema, induration, drainage, or persistent bleeding.   Images permanently stored and available for review in the ultrasound unit.  Impression: Technically successful ultrasound guided injection.  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  Mild patellofemoral DJD.  No acute fractures are visible.  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  Moderate patellofemoral DJD.  No acute fractures are visible.  Await formal radiology review   Assessment and Plan: 47 y.o. female with bilateral knee pain due to exacerbation of DJD.  Discussed options.  Plan for steroid injections  today.  We looked up the safety of triamcinolone with breast-feeding.  The anticipated infant dose of  80 mg of triamcinolone today is quite low and I consider it to be reasonably safe. Alauna agrees.  Check back as needed.  Could repeat this injection every 3 months if needed.  If this does not work long enough we can try other options including hyaluronic acid injections or Zilretta injections.  This will require authorization.   PDMP not reviewed this encounter. Orders Placed This Encounter  Procedures   [place] LIMITED JOINT SPACE STRUCTURES LOW BILAT(NO LINKED CHARGES)    Order Specific Question:   Reason for Exam (SYMPTOM  OR DIAGNOSIS REQUIRED)    Answer:   bilat knee pain    Order Specific Question:   Preferred imaging location?    Answer:   [facility] Knee AP/LAT W/Sunrise Left    Standing Status:   Future    Number of Occurrences:   1    Standing Expiration Date:   09/25/2022    Order Specific Question:   Reason for Exam (SYMPTOM  OR DIAGNOSIS REQUIRED)    Answer:   bilateral knee pain    Order Specific Question:   Preferred imaging location?    Answer:   [facility]    Order Specific Question:   Is patient pregnant?    Answer:   No   DG Knee AP/LAT W/Sunrise Right    Standing Status:   Future    Number of Occurrences:   1    Standing Expiration Date:   09/25/2022    Order Specific Question:   Reason for Exam (SYMPTOM  OR DIAGNOSIS REQUIRED)    Answer:   bilateral knee pain    Order Specific Question:   Preferred imaging location?    Answer:   [facility]    Order Specific Question:   Is patient pregnant?    Answer:   No   No orders of the defined types were placed in this encounter.    Discussed warning signs or symptoms. Please see discharge instructions. Patient expresses understanding.   The above documentation has been reviewed and is accurate and complete Kimberly Joseph, M.D.

## 2022-08-25 NOTE — Patient Instructions (Signed)
Thank you for coming in today.   Please get an Xray today before you leave   You received an injection today. Seek immediate medical attention if the joint becomes red, extremely painful, or is oozing fluid.   I can repeat these steroid injections every 3 months if needed.  Let me know

## 2022-08-26 DIAGNOSIS — M170 Bilateral primary osteoarthritis of knee: Secondary | ICD-10-CM | POA: Insufficient documentation

## 2022-08-26 DIAGNOSIS — G8929 Other chronic pain: Secondary | ICD-10-CM | POA: Insufficient documentation

## 2022-08-27 ENCOUNTER — Ambulatory Visit: Payer: Medicaid Other | Admitting: Physician Assistant

## 2022-09-02 NOTE — Progress Notes (Signed)
Right knee x-ray shows mild arthritis.

## 2022-09-02 NOTE — Progress Notes (Signed)
Left knee x-ray shows medium arthritis.

## 2022-10-27 DIAGNOSIS — Z7189 Other specified counseling: Secondary | ICD-10-CM | POA: Diagnosis not present

## 2022-11-24 ENCOUNTER — Ambulatory Visit: Payer: Medicaid Other | Admitting: Family Medicine

## 2022-12-01 ENCOUNTER — Other Ambulatory Visit: Payer: Self-pay

## 2022-12-01 ENCOUNTER — Ambulatory Visit: Payer: Medicaid Other | Admitting: Family Medicine

## 2022-12-01 VITALS — BP 136/84 | HR 101 | Ht 66.0 in | Wt 352.0 lb

## 2022-12-01 DIAGNOSIS — M25561 Pain in right knee: Secondary | ICD-10-CM | POA: Diagnosis not present

## 2022-12-01 DIAGNOSIS — G8929 Other chronic pain: Secondary | ICD-10-CM

## 2022-12-01 DIAGNOSIS — M25562 Pain in left knee: Secondary | ICD-10-CM | POA: Diagnosis not present

## 2022-12-01 DIAGNOSIS — M170 Bilateral primary osteoarthritis of knee: Secondary | ICD-10-CM

## 2022-12-01 NOTE — Patient Instructions (Addendum)
Thank you for coming in today.   You received an injection today. Seek immediate medical attention if the joint becomes red, extremely painful, or is oozing fluid.   Let me know if these shot don't last and we will work to authorize [employer]

## 2022-12-01 NOTE — Progress Notes (Unsigned)
Kimberly Payor, PhD, LAT, ATC acting as a scribe for Kimberly Graham, MD.  Kimberly Joseph is a 47 y.o. female who presents to [facility] at [facility] today for 73-month f/u bilat knee pain. Pt was last seen by Dr. Denyse Amass on 08/25/22 and was given bilat knee steroid injections.  Today, pt reports bilat knee pain has returned and is intermittent. Seems to be exacerbated by prolonged sitting. She notes that she has switched to using a standing desk for work and has been very active lately.  Dx imaging: 08/25/22 R & L knee XR  Pertinent review of systems: No fevers or chills  Relevant historical information: Hypertension Currently breast-feeding  Exam:  BP 136/84   Pulse (!) 101   Ht 5\' 6"  (1.676 m)   Wt (!) 352 lb (159.7 kg)   SpO2 98%   BMI 56.81 kg/m  General: Well Developed, well nourished, and in no acute distress.   MSK: Knees bilaterally no erythema.  Mild effusion normal motion.    Lab and Radiology Results  Procedure: Real-time Ultrasound Guided Injection of right knee joint superior lateral patella space Device: Philips Affiniti 50G/GE Logiq Images permanently stored and available for review in PACS Verbal informed consent obtained.  Discussed risks and benefits of procedure. Warned about infection, bleeding, hyperglycemia damage to structures among others. Patient expresses understanding and agreement Time-out conducted.   Noted no overlying erythema, induration, or other signs of local infection.   Skin prepped in a sterile fashion.   Local anesthesia: Topical Ethyl chloride.   With sterile technique and under real time ultrasound guidance: 40 mg of Kenalog and 2 mL of Marcaine injected into knee joint. Fluid seen entering the joint capsule.   Completed without difficulty   Pain immediately resolved suggesting accurate placement of the medication.   Advised to call if fevers/chills, erythema, induration, drainage, or persistent bleeding.   Images  permanently stored and available for review in the ultrasound unit.  Impression: Technically successful ultrasound guided injection.   Procedure: Real-time Ultrasound Guided Injection of left knee joint superior lateral patella space Device: Philips Affiniti 50G/GE Logiq Images permanently stored and available for review in PACS Verbal informed consent obtained.  Discussed risks and benefits of procedure. Warned about infection, bleeding, hyperglycemia damage to structures among others. Patient expresses understanding and agreement Time-out conducted.   Noted no overlying erythema, induration, or other signs of local infection.   Skin prepped in a sterile fashion.   Local anesthesia: Topical Ethyl chloride.   With sterile technique and under real time ultrasound guidance: 40 mg of Kenalog and 2 mL of Marcaine injected into knee joint. Fluid seen entering the joint capsule.   Completed without difficulty   Pain immediately resolved suggesting accurate placement of the medication.   Advised to call if fevers/chills, erythema, induration, drainage, or persistent bleeding.   Images permanently stored and available for review in the ultrasound unit.  Impression: Technically successful ultrasound guided injection.   Spinal needle used for both injections.    Assessment and Plan: 47 y.o. female with bilateral knee pain due to DJD exacerbation.  Plan for conventional steroid injection today.  If this does not last longer than 3 months would recommend authorizing Zilretta.  She will contact my office if this injection does not last long enough.   PDMP not reviewed this encounter. Orders Placed This Encounter  Procedures   [place] LIMITED JOINT SPACE STRUCTURES LOW BILAT(NO LINKED CHARGES)    Order Specific Question:  Reason for Exam (SYMPTOM  OR DIAGNOSIS REQUIRED)    Answer:   bilateral knee pain    Order Specific Question:   Preferred imaging location?    Answer:   [facility]   No orders of the defined types were placed in this encounter.    Discussed warning signs or symptoms. Please see discharge instructions. Patient expresses understanding.   The above documentation has been reviewed and is accurate and complete Kimberly Joseph, M.D.

## 2022-12-02 DIAGNOSIS — Z6841 Body Mass Index (BMI) 40.0 and over, adult: Secondary | ICD-10-CM | POA: Diagnosis not present

## 2022-12-02 DIAGNOSIS — E7800 Pure hypercholesterolemia, unspecified: Secondary | ICD-10-CM | POA: Diagnosis not present

## 2022-12-02 DIAGNOSIS — E66813 Obesity, class 3: Secondary | ICD-10-CM | POA: Diagnosis not present

## 2022-12-02 DIAGNOSIS — E559 Vitamin D deficiency, unspecified: Secondary | ICD-10-CM | POA: Diagnosis not present

## 2022-12-02 DIAGNOSIS — R7303 Prediabetes: Secondary | ICD-10-CM | POA: Diagnosis not present

## 2022-12-29 ENCOUNTER — Other Ambulatory Visit: Payer: Self-pay

## 2022-12-29 DIAGNOSIS — E66813 Obesity, class 3: Secondary | ICD-10-CM | POA: Diagnosis not present

## 2022-12-29 DIAGNOSIS — Z6841 Body Mass Index (BMI) 40.0 and over, adult: Secondary | ICD-10-CM | POA: Diagnosis not present

## 2022-12-29 DIAGNOSIS — E559 Vitamin D deficiency, unspecified: Secondary | ICD-10-CM | POA: Diagnosis not present

## 2022-12-29 DIAGNOSIS — R7303 Prediabetes: Secondary | ICD-10-CM | POA: Diagnosis not present

## 2022-12-29 DIAGNOSIS — E7800 Pure hypercholesterolemia, unspecified: Secondary | ICD-10-CM | POA: Diagnosis not present

## 2022-12-30 ENCOUNTER — Other Ambulatory Visit: Payer: Self-pay

## 2022-12-30 MED ORDER — SEMAGLUTIDE-WEIGHT MANAGEMENT 0.5 MG/0.5ML ~~LOC~~ SOAJ
0.5000 mg | SUBCUTANEOUS | 0 refills | Status: AC
  Filled 2022-12-30: qty 2, 28d supply, fill #0

## 2023-01-11 ENCOUNTER — Other Ambulatory Visit: Payer: Self-pay

## 2023-01-12 ENCOUNTER — Other Ambulatory Visit: Payer: Self-pay

## 2023-01-12 DIAGNOSIS — Z6841 Body Mass Index (BMI) 40.0 and over, adult: Secondary | ICD-10-CM | POA: Diagnosis not present

## 2023-01-12 DIAGNOSIS — E66813 Obesity, class 3: Secondary | ICD-10-CM | POA: Diagnosis not present

## 2023-01-12 DIAGNOSIS — R7303 Prediabetes: Secondary | ICD-10-CM | POA: Diagnosis not present

## 2023-01-12 MED ORDER — WEGOVY 1 MG/0.5ML ~~LOC~~ SOAJ
1.0000 mg | SUBCUTANEOUS | 0 refills | Status: AC
  Filled 2023-01-12: qty 2, 30d supply, fill #0
  Filled 2023-01-26: qty 2, 28d supply, fill #0

## 2023-01-13 ENCOUNTER — Other Ambulatory Visit: Payer: Self-pay

## 2023-01-26 ENCOUNTER — Other Ambulatory Visit: Payer: Self-pay

## 2023-01-28 DIAGNOSIS — N898 Other specified noninflammatory disorders of vagina: Secondary | ICD-10-CM | POA: Diagnosis not present

## 2023-01-29 ENCOUNTER — Other Ambulatory Visit: Payer: Self-pay | Admitting: Medical Genetics

## 2023-02-01 ENCOUNTER — Telehealth: Payer: Self-pay | Admitting: Family Medicine

## 2023-02-01 DIAGNOSIS — M170 Bilateral primary osteoarthritis of knee: Secondary | ICD-10-CM

## 2023-02-01 DIAGNOSIS — G8929 Other chronic pain: Secondary | ICD-10-CM

## 2023-02-01 NOTE — Telephone Encounter (Signed)
Patient calling to get knee injection. Per AVS notes from last visit, sounds like he wanted to try Zilretta for her knee.  No insurance change for 2025. Advised pt we would contact for scheduling once auth returned.

## 2023-02-02 ENCOUNTER — Other Ambulatory Visit: Payer: Self-pay

## 2023-02-02 DIAGNOSIS — G4733 Obstructive sleep apnea (adult) (pediatric): Secondary | ICD-10-CM | POA: Diagnosis not present

## 2023-02-02 DIAGNOSIS — E66813 Obesity, class 3: Secondary | ICD-10-CM | POA: Diagnosis not present

## 2023-02-02 DIAGNOSIS — E559 Vitamin D deficiency, unspecified: Secondary | ICD-10-CM | POA: Diagnosis not present

## 2023-02-02 DIAGNOSIS — Z6841 Body Mass Index (BMI) 40.0 and over, adult: Secondary | ICD-10-CM | POA: Diagnosis not present

## 2023-02-02 DIAGNOSIS — R7303 Prediabetes: Secondary | ICD-10-CM | POA: Diagnosis not present

## 2023-02-02 MED ORDER — ERGOCALCIFEROL 1.25 MG (50000 UT) PO CAPS
50000.0000 [IU] | ORAL_CAPSULE | ORAL | 0 refills | Status: AC
  Filled 2023-02-02: qty 12, 84d supply, fill #0

## 2023-02-02 MED ORDER — WEGOVY 1.7 MG/0.75ML ~~LOC~~ SOAJ
1.7000 mg | SUBCUTANEOUS | 0 refills | Status: AC
  Filled 2023-02-02 – 2023-02-22 (×3): qty 3, 28d supply, fill #0

## 2023-02-04 ENCOUNTER — Other Ambulatory Visit: Payer: Self-pay

## 2023-02-04 NOTE — Telephone Encounter (Signed)
VOB initiated for ZILRETTA for BILAT knee OA.

## 2023-02-08 ENCOUNTER — Encounter (HOSPITAL_COMMUNITY): Payer: Self-pay

## 2023-02-08 ENCOUNTER — Other Ambulatory Visit: Payer: Self-pay

## 2023-02-08 MED ORDER — TRIAMCINOLONE ACETONIDE 32 MG IX SRER
INTRA_ARTICULAR | 0 refills | Status: DC
  Filled 2023-02-08 (×2): qty 2, 28d supply, fill #0
  Filled 2023-02-10: qty 2, 1d supply, fill #0

## 2023-02-08 NOTE — Telephone Encounter (Signed)
 Prior Authorization NOT required for Medical Buy and Annette Stable  Prior Authorization NOT required for Specialty Pharmacy  Will proceed with Specialty Pharmacy - [facility]

## 2023-02-08 NOTE — Addendum Note (Signed)
 Addended by: Dierdre Searles on: 02/08/2023 11:38 AM   Modules accepted: Orders

## 2023-02-08 NOTE — Progress Notes (Signed)
 Specialty Pharmacy Initial Fill Coordination Note  Kimberly Joseph is a 48 y.o. female contacted today regarding initial fill of specialty medication(s) Triamcinolone  Acetonide (ZILRETTA )   Patient requested Courier to Provider Office   Delivery date: 02/10/23   Verified address: [facility] [address]   Medication will be filled on 1/7.   Patient is aware of $0 copayment.

## 2023-02-09 ENCOUNTER — Other Ambulatory Visit: Payer: Self-pay

## 2023-02-10 ENCOUNTER — Other Ambulatory Visit: Payer: Self-pay

## 2023-02-11 ENCOUNTER — Other Ambulatory Visit: Payer: Self-pay

## 2023-02-12 ENCOUNTER — Other Ambulatory Visit (HOSPITAL_COMMUNITY): Payer: Self-pay

## 2023-02-12 ENCOUNTER — Other Ambulatory Visit: Payer: Self-pay

## 2023-02-15 NOTE — Telephone Encounter (Signed)
 Medication received. Left message for patient to call back to schedule.

## 2023-02-15 NOTE — Telephone Encounter (Signed)
 Scheduled 02/15/2023

## 2023-02-16 ENCOUNTER — Ambulatory Visit (INDEPENDENT_AMBULATORY_CARE_PROVIDER_SITE_OTHER): Payer: Medicaid Other | Admitting: Family Medicine

## 2023-02-16 ENCOUNTER — Other Ambulatory Visit: Payer: Self-pay

## 2023-02-16 VITALS — BP 128/82 | HR 101 | Ht 66.0 in | Wt 347.0 lb

## 2023-02-16 DIAGNOSIS — M25562 Pain in left knee: Secondary | ICD-10-CM | POA: Diagnosis not present

## 2023-02-16 DIAGNOSIS — M170 Bilateral primary osteoarthritis of knee: Secondary | ICD-10-CM

## 2023-02-16 DIAGNOSIS — G8929 Other chronic pain: Secondary | ICD-10-CM | POA: Diagnosis not present

## 2023-02-16 DIAGNOSIS — M25561 Pain in right knee: Secondary | ICD-10-CM

## 2023-02-16 MED ORDER — TRIAMCINOLONE ACETONIDE 32 MG IX SRER
32.0000 mg | Freq: Once | INTRA_ARTICULAR | Status: AC
  Administered 2023-02-16: 32 mg via INTRA_ARTICULAR

## 2023-02-16 NOTE — Patient Instructions (Signed)
 Thank you for coming in today.   You received an injection today. Seek immediate medical attention if the joint becomes red, extremely painful, or is oozing fluid.

## 2023-02-16 NOTE — Progress Notes (Signed)
 LILLETTE Ileana Collet, PhD, LAT, ATC acting as a scribe for Artist Lloyd, MD.  Kimberly Joseph is a 48 y.o. female who presents to [facility] at [facility] today for f/u bilat knee pain. Pt was last seen by Dr. Lloyd on 12/01/22 and was given bilat knee steroid injections.  Today, pt reports both knees are very painful, esp the R knee. She feels like they are swollen. She is wanting to proceed bilat Zilretta  injection today.   She is potentially interested in trying hyaluronic acid injections.  Dx imaging: 08/25/22 R & L knee XR   Pertinent review of systems: No fevers or chills  Relevant historical information: Hypertension   Exam:  BP 128/82   Pulse (!) 101   Ht 5' 6 (1.676 m)   Wt (!) 347 lb (157.4 kg)   SpO2 100%   BMI 56.01 kg/m  General: Well Developed, well nourished, and in no acute distress.   MSK: Knees bilaterally mild effusion normal motion with crepitation.    Lab and Radiology Results   Zilretta  injection bilateral knee Procedure: Real-time Ultrasound Guided Injection of right knee joint superior lateral patellar space Device: Philips Affiniti 50G Images permanently stored and available for review in PACS Verbal informed consent obtained.  Discussed risks and benefits of procedure. Warned about infection, hyperglycemia bleeding, damage to structures among others. Patient expresses understanding and agreement Time-out conducted.   Noted no overlying erythema, induration, or other signs of local infection.   Skin prepped in a sterile fashion.   Local anesthesia: Topical Ethyl chloride.   With sterile technique and under real time ultrasound guidance: Zilretta  32 mg injected into knee joint. Fluid seen entering the joint capsule.   Completed without difficulty   Advised to call if fevers/chills, erythema, induration, drainage, or persistent bleeding.   Images permanently stored and available for review in the ultrasound unit.  Impression:  Technically successful ultrasound guided injection.   Procedure: Real-time Ultrasound Guided Injection of left knee joint superior lateral patellar space Device: Philips Affiniti 50G Images permanently stored and available for review in PACS Verbal informed consent obtained.  Discussed risks and benefits of procedure. Warned about infection, hyperglycemia bleeding, damage to structures among others. Patient expresses understanding and agreement Time-out conducted.   Noted no overlying erythema, induration, or other signs of local infection.   Skin prepped in a sterile fashion.   Local anesthesia: Topical Ethyl chloride.   With sterile technique and under real time ultrasound guidance: Zilretta  32 mg injected into knee joint. Fluid seen entering the joint capsule.   Completed without difficulty   Advised to call if fevers/chills, erythema, induration, drainage, or persistent bleeding.   Images permanently stored and available for review in the ultrasound unit.  Impression: Technically successful ultrasound guided injection.  Lot number: [id number] Zilretta  was obtained through pharmacy benefit not buy and bill.      Assessment and Plan: 48 y.o. female with bilateral knee pain due to DJD.  Plan for repeat Zilretta  injection today.  Consider hyaluronic acid injections in the future or potentially even genicular artery embolization.  Urgent surgery would be potentially helpful if she is not able to lose enough weight with Dr. Prentiss.   PDMP not reviewed this encounter. Orders Placed This Encounter  Procedures   [place]  LIMITED JOINT SPACE STRUCTURES LOW BILAT(NO LINKED CHARGES)    Reason for Exam (SYMPTOM  OR DIAGNOSIS REQUIRED):   bilateral knee pain    Preferred imaging location?:   [facility]   Meds ordered this encounter  Medications   Triamcinolone  Acetonide (ZILRETTA ) intra-articular injection 32 mg   Triamcinolone  Acetonide (ZILRETTA ) intra-articular  injection 32 mg     Discussed warning signs or symptoms. Please see discharge instructions. Patient expresses understanding.   The above documentation has been reviewed and is accurate and complete Artist Lloyd, M.D.

## 2023-02-22 ENCOUNTER — Other Ambulatory Visit: Payer: Self-pay

## 2023-02-24 DIAGNOSIS — R7303 Prediabetes: Secondary | ICD-10-CM | POA: Diagnosis not present

## 2023-02-24 DIAGNOSIS — E66813 Obesity, class 3: Secondary | ICD-10-CM | POA: Diagnosis not present

## 2023-02-24 DIAGNOSIS — Z6841 Body Mass Index (BMI) 40.0 and over, adult: Secondary | ICD-10-CM | POA: Diagnosis not present

## 2023-02-24 DIAGNOSIS — E559 Vitamin D deficiency, unspecified: Secondary | ICD-10-CM | POA: Diagnosis not present

## 2023-02-24 DIAGNOSIS — G4733 Obstructive sleep apnea (adult) (pediatric): Secondary | ICD-10-CM | POA: Diagnosis not present

## 2023-02-25 ENCOUNTER — Other Ambulatory Visit: Payer: Self-pay

## 2023-02-25 NOTE — Telephone Encounter (Signed)
Pt received Zilretta injections for BILAT knee OA on 02/16/23. Can consider repeat injections on or after 05/12/23.

## 2023-02-26 ENCOUNTER — Other Ambulatory Visit (HOSPITAL_COMMUNITY): Payer: Medicaid Other | Attending: Medical Genetics

## 2023-03-17 DIAGNOSIS — Z6841 Body Mass Index (BMI) 40.0 and over, adult: Secondary | ICD-10-CM | POA: Diagnosis not present

## 2023-03-17 DIAGNOSIS — N926 Irregular menstruation, unspecified: Secondary | ICD-10-CM | POA: Diagnosis not present

## 2023-03-17 DIAGNOSIS — G4733 Obstructive sleep apnea (adult) (pediatric): Secondary | ICD-10-CM | POA: Diagnosis not present

## 2023-03-17 DIAGNOSIS — R7303 Prediabetes: Secondary | ICD-10-CM | POA: Diagnosis not present

## 2023-03-17 DIAGNOSIS — E559 Vitamin D deficiency, unspecified: Secondary | ICD-10-CM | POA: Diagnosis not present

## 2023-03-17 DIAGNOSIS — E66813 Obesity, class 3: Secondary | ICD-10-CM | POA: Diagnosis not present

## 2023-03-25 ENCOUNTER — Other Ambulatory Visit: Payer: Self-pay

## 2023-04-06 ENCOUNTER — Other Ambulatory Visit: Payer: Self-pay

## 2023-04-19 NOTE — Telephone Encounter (Signed)
Called pt, left VM to call the office.  

## 2023-04-20 ENCOUNTER — Encounter (INDEPENDENT_AMBULATORY_CARE_PROVIDER_SITE_OTHER): Payer: Self-pay

## 2023-04-22 ENCOUNTER — Other Ambulatory Visit: Payer: Self-pay

## 2023-04-22 DIAGNOSIS — R102 Pelvic and perineal pain: Secondary | ICD-10-CM | POA: Diagnosis not present

## 2023-04-22 DIAGNOSIS — N912 Amenorrhea, unspecified: Secondary | ICD-10-CM | POA: Diagnosis not present

## 2023-04-26 ENCOUNTER — Other Ambulatory Visit: Payer: Self-pay

## 2023-04-28 ENCOUNTER — Other Ambulatory Visit: Payer: Self-pay

## 2023-05-20 ENCOUNTER — Telehealth: Payer: Self-pay | Admitting: Family Medicine

## 2023-05-20 DIAGNOSIS — N914 Secondary oligomenorrhea: Secondary | ICD-10-CM | POA: Diagnosis not present

## 2023-05-20 DIAGNOSIS — G8929 Other chronic pain: Secondary | ICD-10-CM

## 2023-05-20 DIAGNOSIS — R102 Pelvic and perineal pain: Secondary | ICD-10-CM | POA: Diagnosis not present

## 2023-05-20 DIAGNOSIS — M170 Bilateral primary osteoarthritis of knee: Secondary | ICD-10-CM

## 2023-05-20 NOTE — Telephone Encounter (Signed)
 Pt called to schedule bil Zilretta knees. Per notes, last done 02/2023 and was ordered through pharmacy.  Did not schedule, unsure if another insurance approval needed and if we would still go through her pharmacy.  Please call pt asap, ready to schedule.

## 2023-05-24 ENCOUNTER — Other Ambulatory Visit: Payer: Self-pay

## 2023-05-24 MED ORDER — TRIAMCINOLONE ACETONIDE 32 MG IX SRER
INTRA_ARTICULAR | 0 refills | Status: DC
  Filled 2023-05-24: qty 2, fill #0
  Filled 2023-07-14: qty 2, 28d supply, fill #0

## 2023-05-24 NOTE — Telephone Encounter (Signed)
 Me     02/08/23 11:34 AM Note Prior Authorization NOT required for Medical Buy and Bill   Prior Authorization NOT required for Specialty Pharmacy   Will proceed with Specialty Pharmacy - [facility]

## 2023-05-24 NOTE — Telephone Encounter (Signed)
 Rx for Zilretta  for BILAT knee OA sent to [facility].   Can call to scheduled once received in office.   Thanks!

## 2023-07-07 ENCOUNTER — Other Ambulatory Visit: Payer: Self-pay

## 2023-07-07 NOTE — Progress Notes (Signed)
 Disenrolling - call center unable to reach patient. Medication last filled  1.9.25.

## 2023-07-14 ENCOUNTER — Other Ambulatory Visit: Payer: Self-pay

## 2023-07-14 ENCOUNTER — Other Ambulatory Visit (HOSPITAL_COMMUNITY): Payer: Self-pay

## 2023-07-14 NOTE — Progress Notes (Signed)
 Specialty Pharmacy Initial Fill Coordination Note  Kimberly Joseph is a 48 y.o. female contacted today regarding initial fill of specialty medication(s) Triamcinolone  Acetonide (ZILRETTA )   Patient requested Courier to Provider Office   Delivery date: 07/15/23   Verified address: [facility] at [address]   Medication will be filled on 6/12.   Patient is aware of $4 copayment.

## 2023-07-15 ENCOUNTER — Other Ambulatory Visit: Payer: Self-pay

## 2023-07-15 NOTE — Telephone Encounter (Signed)
 Patient is currently scheduled for Tuesday. Medication received. Tried to call patient, call would not go through. Sent MyChart message to try to get patient in sooner. I have a spot held tomorrow at 9am.

## 2023-07-15 NOTE — Progress Notes (Signed)
 Zilretta  courier to Office on 07/15/23  Control number: [id number]

## 2023-07-19 NOTE — Progress Notes (Unsigned)
   Joanna Muck, PhD, LAT, ATC acting as a scribe for Garlan Juniper, MD.  Kimberly Joseph is a 48 y.o. female who presents to [facility] at [facility] today for exacerbation of her bilat knee pain. Pt was last seen by Dr. Alease Hunter on 02/16/23 and was given bilat knee Zilretta  injections.  Today, pt reports ***  Dx imaging: 08/25/22 R & L knee XR  Pertinent review of systems: ***  Relevant historical information: ***   Exam:  There were no vitals taken for this visit. General: Well Developed, well nourished, and in no acute distress.   MSK: ***    Lab and Radiology Results No results found for this or any previous visit (from the past 72 hours). No results found.     Assessment and Plan: 48 y.o. female with ***   PDMP not reviewed this encounter. No orders of the defined types were placed in this encounter.  No orders of the defined types were placed in this encounter.    Discussed warning signs or symptoms. Please see discharge instructions. Patient expresses understanding.   ***

## 2023-07-20 ENCOUNTER — Encounter: Payer: Self-pay | Admitting: Family Medicine

## 2023-07-20 ENCOUNTER — Other Ambulatory Visit: Payer: Self-pay

## 2023-07-20 ENCOUNTER — Ambulatory Visit (INDEPENDENT_AMBULATORY_CARE_PROVIDER_SITE_OTHER): Admitting: Family Medicine

## 2023-07-20 VITALS — BP 130/84 | HR 49 | Ht 66.0 in | Wt 342.0 lb

## 2023-07-20 DIAGNOSIS — E66813 Obesity, class 3: Secondary | ICD-10-CM | POA: Diagnosis not present

## 2023-07-20 DIAGNOSIS — G8929 Other chronic pain: Secondary | ICD-10-CM

## 2023-07-20 DIAGNOSIS — M25561 Pain in right knee: Secondary | ICD-10-CM | POA: Diagnosis not present

## 2023-07-20 DIAGNOSIS — M25562 Pain in left knee: Secondary | ICD-10-CM | POA: Diagnosis not present

## 2023-07-20 DIAGNOSIS — M170 Bilateral primary osteoarthritis of knee: Secondary | ICD-10-CM

## 2023-07-20 MED ORDER — TRIAMCINOLONE ACETONIDE 32 MG IX SRER
32.0000 mg | Freq: Once | INTRA_ARTICULAR | Status: AC
  Administered 2023-07-20: 32 mg via INTRA_ARTICULAR

## 2023-07-20 NOTE — Patient Instructions (Signed)
 Thank you for coming in today.   You received an injection today. Seek immediate medical attention if the joint becomes red, extremely painful, or is oozing fluid.   Information provided today for GAE.   Referral placed for bariatric consultation.   See you back as needed.

## 2023-09-23 ENCOUNTER — Other Ambulatory Visit: Payer: Self-pay

## 2023-09-27 ENCOUNTER — Other Ambulatory Visit: Payer: Self-pay

## 2023-09-30 ENCOUNTER — Other Ambulatory Visit: Payer: Self-pay

## 2023-10-01 ENCOUNTER — Telehealth: Payer: Self-pay

## 2023-10-01 NOTE — Telephone Encounter (Signed)
 Pt called because she was confused on the referral and procedure discussed at her last visit. Clarified and provided # to call [facility]. Also discussed GAE procedure and placed a referral for consultation.

## 2023-10-01 NOTE — Addendum Note (Signed)
 Addended by: GEROME ILEANA RAMAN on: 10/01/2023 12:59 PM   Modules accepted: Orders

## 2023-10-06 DIAGNOSIS — N898 Other specified noninflammatory disorders of vagina: Secondary | ICD-10-CM | POA: Diagnosis not present

## 2023-10-06 DIAGNOSIS — Z113 Encounter for screening for infections with a predominantly sexual mode of transmission: Secondary | ICD-10-CM | POA: Diagnosis not present

## 2023-10-06 DIAGNOSIS — Z01419 Encounter for gynecological examination (general) (routine) without abnormal findings: Secondary | ICD-10-CM | POA: Diagnosis not present

## 2023-10-13 ENCOUNTER — Other Ambulatory Visit: Payer: Self-pay

## 2023-10-13 ENCOUNTER — Other Ambulatory Visit: Payer: Self-pay | Admitting: Family Medicine

## 2023-10-13 DIAGNOSIS — M170 Bilateral primary osteoarthritis of knee: Secondary | ICD-10-CM

## 2023-10-13 DIAGNOSIS — G8929 Other chronic pain: Secondary | ICD-10-CM

## 2023-10-13 NOTE — Progress Notes (Signed)
 Specialty Pharmacy Refill Coordination Note  Kimberly Joseph is a 48 y.o. female contacted today regarding refills of specialty medication(s) Triamcinolone  Acetonide (ZILRETTA )   Patient requested Courier to Provider Office   Delivery date: 10/19/23   Verified address: [facility] at [address]   Medication will be filled on 09.15.25 or when refill request is approved .   Per patient she does not have an appt scheduled yet but the office will call her once they receive zilretta .

## 2023-10-14 ENCOUNTER — Other Ambulatory Visit (HOSPITAL_COMMUNITY): Payer: Self-pay

## 2023-10-18 ENCOUNTER — Other Ambulatory Visit: Payer: Self-pay

## 2023-10-18 MED ORDER — ZILRETTA 32 MG IX SRER
INTRA_ARTICULAR | 0 refills | Status: DC
  Filled 2023-10-18: qty 2, 84d supply, fill #0

## 2023-10-18 NOTE — Progress Notes (Signed)
 Chief Complaint: Patient was seen in consultation today for bilateral knee pain   Referring Physician(s): Corey,Evan S  History of Present Illness: Kimberly Joseph is a 48 y.o. female with a medical history significant for obesity, back pain and bilateral knee pain due to DJD exacerbation. She was first referred to Sports Medicine August 2023 for treatment of bilateral knee pain, Left > right. She had been taking tylenol  but was no longer getting relief. She complained of numbness and tingling, locking, clicking and popping sensations. She was initially treated with bilateral steroid injections and experienced a few months of relief. She received additional rounds of steroid injections before progressing to the Zilretta  injections. She has tried to lose weight with Mounjaro but was unable to tolerate the drug. She is now considering bariatric surgery and has been referred to Surgery.   The Zilretta  injections work for a few months before wearing off and the patient is interested in a therapy that will offer longer lasting relief. Dr. Joane has kindly referred this patient to Interventional Radiology to discuss geniculate artery embolization as a possible treatment option.   Womac Pain Score = 60/96 VAS Pain Score = 10/10   Past Medical History:  Diagnosis Date   History of gestational diabetes in prior pregnancy, currently pregnant    History of pre-eclampsia in prior pregnancy, currently pregnant    Pregnancy induced hypertension     Past Surgical History:  Procedure Laterality Date   NO PAST SURGERIES      Allergies: Patient has no known allergies.  Medications: Prior to Admission medications   Medication Sig Start Date End Date Taking? Authorizing Provider  ergocalciferol  (VITAMIN D2) 1.25 MG (50000 UT) capsule Take 1 capsule (50,000 Units total) by mouth once a week. 02/02/23     ibuprofen  (ADVIL ) 600 MG tablet Take 1 tablet (600 mg total) by mouth every 6 (six) hours as  needed for mild pain, moderate pain or cramping. 04/22/22   Storm Setter, DO  Prenatal Vit w/Fe-Methylfol-FA (PNV PO) Take 1 tablet by mouth daily.     [provider]  Semaglutide -Weight Management (WEGOVY ) 1 MG/0.5ML SOAJ Inject 1 mg into the skin once a week. 01/12/23     Semaglutide -Weight Management (WEGOVY ) 1.7 MG/0.75ML SOAJ Inject 1.7 mg into the skin once a week. 02/02/23     Semaglutide -Weight Management 0.5 MG/0.5ML SOAJ Inject 0.5 mg into the skin once a week. 12/29/22   Hadassah Powell HERO, PA-C  Triamcinolone  Acetonide (ZILRETTA ) 32 MG SRER intra-articular injection 5 mL, intra-articular, bilateral knees 10/18/23   [name], MD     Family History  Problem Relation Age of Onset   Asthma Mother    Hypertension Mother    Miscarriages / [place] Mother    Cancer Father    Hypertension Father    Vision loss Father    Stroke Maternal Grandfather    Heart disease Neg Hx    Diabetes Neg Hx     Social History   Socioeconomic History   Marital status: Married    Spouse name: Not on file   Number of children: Not on file   Years of education: Not on file   Highest education level: Not on file  Occupational History   Not on file  Tobacco Use   Smoking status: Never   Smokeless tobacco: Never  Vaping Use   Vaping status: Never Used  Substance and Sexual Activity   Alcohol use: No   Drug use: No   Sexual  activity: Yes    Birth control/protection: None  Other Topics Concern   Not on file  Social History Narrative   Not on file   Social Drivers of Health   Financial Resource Strain: Not on file  Food Insecurity: Food Insecurity Present (04/21/2022)   Hunger Vital Sign    Worried About Running Out of Food in the Last Year: Sometimes true    Ran Out of Food in the Last Year: Sometimes true  Transportation Needs: No Transportation Needs (04/21/2022)   PRAPARE - Administrator, Civil Service (Medical): No    Lack of Transportation  (Non-Medical): No  Physical Activity: Not on file  Stress: Not on file  Social Connections: Not on file    Review of Systems: A 12 point ROS discussed and pertinent positives are indicated in the HPI above.  All other systems are negative.  Vital Signs: There were no vitals taken for this visit.   Physical Exam Constitutional:      General: She is not in acute distress. HENT:     Head: Normocephalic.     Mouth/Throat:     Mouth: Mucous membranes are moist.  Eyes:     General: No scleral icterus. Cardiovascular:     Rate and Rhythm: Normal rate and regular rhythm.  Pulmonary:     Effort: No respiratory distress.  Abdominal:     General: There is no distension.  Musculoskeletal:     Right lower leg: No edema.     Left lower leg: No edema.       Legs:     Comments: Tender to palpation.  Skin:    General: Skin is warm and dry.  Neurological:     Mental Status: She is alert and oriented to person, place, and time.     Imaging:  Bilateral knee X-rays 08/25/22     Kellgren and Jerilynn Grade II bilaterally  Labs:  CBC: No results for input(s): WBC, HGB, HCT, PLT in the last 8760 hours.  COAGS: No results for input(s): INR, APTT in the last 8760 hours.  BMP: No results for input(s): NA, K, CL, CO2, GLUCOSE, BUN, CALCIUM, CREATININE, GFRNONAA, GFRAA in the last 8760 hours.  Invalid input(s): CMP  LIVER FUNCTION TESTS: No results for input(s): BILITOT, AST, ALT, ALKPHOS, PROT, ALBUMIN in the last 8760 hours.  TUMOR MARKERS: No results for input(s): AFPTM, CEA, CA199, CHROMGRNA in the last 8760 hours.  Assessment and Plan: 48 year old female with a history of bilateral knee osteoarthritis (K&G 2) with associated severe pain [facility] 60/96) which has been refractory to conservative treatment options.  She would be an excellent candidate for geniculate artery embolization.  We discussed the rationale,  periprocedural expectations, and long term expected outcomes after geniculate artery embolization.  She would like to proceed.  Plan for left geniculate artery embolization via retrograde pedal artery access with moderate sedation at [facility].  If she gets good relief from left sided treatment, we can consider right sided treatment at a later date.      Ester Sides, MD Pager: [phone]    I spent a total of  40 Minutes   in face to face in clinical consultation, greater than 50% of which was counseling/coordinating care for bilateral knee pain.

## 2023-10-18 NOTE — Telephone Encounter (Signed)
 Last OV 07/20/23 Next OV not scheduled  Last refill 05/24/23 Qty #2 / 0

## 2023-10-19 ENCOUNTER — Other Ambulatory Visit: Payer: Self-pay

## 2023-10-20 ENCOUNTER — Ambulatory Visit
Admission: RE | Admit: 2023-10-20 | Discharge: 2023-10-20 | Disposition: A | Discharge status: Home / Self Care | Source: Ambulatory Visit | Attending: Family Medicine | Admitting: Family Medicine

## 2023-10-20 ENCOUNTER — Other Ambulatory Visit: Payer: Self-pay

## 2023-10-20 DIAGNOSIS — G8929 Other chronic pain: Secondary | ICD-10-CM

## 2023-10-20 DIAGNOSIS — M170 Bilateral primary osteoarthritis of knee: Secondary | ICD-10-CM | POA: Diagnosis not present

## 2023-10-20 HISTORY — PX: IR RADIOLOGIST EVAL & MGMT: IMG5224

## 2023-10-21 NOTE — Telephone Encounter (Signed)
 Bilateral Zilretta  received. Left message for patient to call back to schedule.

## 2023-10-25 ENCOUNTER — Ambulatory Visit: Admitting: Family Medicine

## 2023-10-25 ENCOUNTER — Other Ambulatory Visit: Payer: Self-pay

## 2023-10-25 VITALS — BP 130/84 | HR 59 | Ht 66.0 in | Wt 342.0 lb

## 2023-10-25 DIAGNOSIS — G8929 Other chronic pain: Secondary | ICD-10-CM

## 2023-10-25 DIAGNOSIS — E66813 Obesity, class 3: Secondary | ICD-10-CM

## 2023-10-25 DIAGNOSIS — M170 Bilateral primary osteoarthritis of knee: Secondary | ICD-10-CM | POA: Diagnosis not present

## 2023-10-25 DIAGNOSIS — M25562 Pain in left knee: Secondary | ICD-10-CM

## 2023-10-25 DIAGNOSIS — M25561 Pain in right knee: Secondary | ICD-10-CM | POA: Diagnosis not present

## 2023-10-25 MED ORDER — TRIAMCINOLONE ACETONIDE 32 MG IX SRER
32.0000 mg | Freq: Once | INTRA_ARTICULAR | Status: AC
  Administered 2023-10-25: 32 mg via INTRA_ARTICULAR

## 2023-10-25 NOTE — Progress Notes (Unsigned)
 LILLETTE Ileana Collet, PhD, LAT, ATC acting as a scribe for Artist Lloyd, MD.  Kimberly Joseph is a 48 y.o. female who presents to [facility] at [facility] today for  exacerbation of her bilat knee pain. Pt was last seen by Dr. Lloyd on 07/20/23 and was given bilat knee Zilretta  injections. Pt also referred for bariatric surgery consult and later referred for GAE, visit was on the 17th.  Today, pt reports bilat knee pain returned about a month ago. She notes she is hopeful to schedule the GAE procedure in the future. Wanting to repeat bilat Zilretta  today.  Dx imaging: 08/25/22 R & L knee XR   Pertinent review of systems: No fevers or chills  Relevant historical information: Hypertension and obesity   Exam:  BP 130/84   Pulse (!) 59   Ht 5' 6 (1.676 m)   Wt (!) 342 lb (155.1 kg)   SpO2 100%   BMI 55.20 kg/m  General: Well Developed, well nourished, and in no acute distress.   MSK: Knees bilaterally mild effusion normal-appearing otherwise normal motion.    Lab and Radiology Results   Zilretta  injection bilateral knee Procedure: Real-time Ultrasound Guided Injection of right knee joint superior lateral patellar space Device: Philips Affiniti 50G Images permanently stored and available for review in PACS Verbal informed consent obtained.  Discussed risks and benefits of procedure. Warned about infection, hyperglycemia bleeding, damage to structures among others. Patient expresses understanding and agreement Time-out conducted.   Noted no overlying erythema, induration, or other signs of local infection.   Skin prepped in a sterile fashion.   Local anesthesia: Topical Ethyl chloride.   With sterile technique and under real time ultrasound guidance: Zilretta  32 mg injected into knee joint. Fluid seen entering the joint capsule.   Completed without difficulty   Advised to call if fevers/chills, erythema, induration, drainage, or persistent bleeding.   Images  permanently stored and available for review in the ultrasound unit.  Impression: Technically successful ultrasound guided injection.   Procedure: Real-time Ultrasound Guided Injection of left knee joint superior lateral patellar space Device: Philips Affiniti 50G Images permanently stored and available for review in PACS Verbal informed consent obtained.  Discussed risks and benefits of procedure. Warned about infection, hyperglycemia bleeding, damage to structures among others. Patient expresses understanding and agreement Time-out conducted.   Noted no overlying erythema, induration, or other signs of local infection.   Skin prepped in a sterile fashion.   Local anesthesia: Topical Ethyl chloride.   With sterile technique and under real time ultrasound guidance: Zilretta  32 mg injected into knee joint. Fluid seen entering the joint capsule.   Completed without difficulty   Advised to call if fevers/chills, erythema, induration, drainage, or persistent bleeding.   Images permanently stored and available for review in the ultrasound unit.  Impression: Technically successful ultrasound guided injection.  Lot number: [id number] Patient provided her own Zilretta  through her pharmacy benefit.  No charge for Zilretta  today.   Assessment and Plan: 48 y.o. female with bilateral knee pain due to osteoarthritis.  Plan for Zilretta  injection.  Can repeat this in 3 months.  We spent a lot of time talking about long-term planning.  She is struggling to lose sufficient weight to proceed with knee replacement which inevitably will happen in the future.  We did spend time talking about bariatric surgery and I do recommend keeping an appointment with them.  She is already had some video discussions but has not had a  formal visit.  I do think that would be quite helpful for her in the long run.   PDMP not reviewed this encounter. Orders Placed This Encounter  Procedures   US  LIMITED JOINT SPACE  STRUCTURES LOW BILAT(NO LINKED CHARGES)    Reason for Exam (SYMPTOM  OR DIAGNOSIS REQUIRED):   bilateral knee pain    Preferred imaging location?:   [facility] ordered this encounter  Medications   Triamcinolone  Acetonide (ZILRETTA ) intra-articular injection 32 mg   Triamcinolone  Acetonide (ZILRETTA ) intra-articular injection 32 mg     Discussed warning signs or symptoms. Please see discharge instructions. Patient expresses understanding.   The above documentation has been reviewed and is accurate and complete Artist Lloyd, M.D.

## 2023-10-25 NOTE — Patient Instructions (Addendum)
 Thank you for coming in today.   You received an injection today. Seek immediate medical attention if the joint becomes red, extremely painful, or is oozing fluid.   I can repeat these injections in 3 months, if needed

## 2023-10-27 ENCOUNTER — Other Ambulatory Visit: Payer: Self-pay | Admitting: Nurse Practitioner

## 2023-10-27 DIAGNOSIS — Z1231 Encounter for screening mammogram for malignant neoplasm of breast: Secondary | ICD-10-CM

## 2023-10-28 ENCOUNTER — Ambulatory Visit
Admission: RE | Admit: 2023-10-28 | Discharge: 2023-10-28 | Disposition: A | Discharge status: Home / Self Care | Source: Ambulatory Visit | Attending: Nurse Practitioner | Admitting: Nurse Practitioner

## 2023-10-28 DIAGNOSIS — Z1231 Encounter for screening mammogram for malignant neoplasm of breast: Secondary | ICD-10-CM | POA: Diagnosis not present

## 2023-11-08 ENCOUNTER — Other Ambulatory Visit: Payer: Self-pay | Admitting: Medical Genetics

## 2023-11-08 DIAGNOSIS — Z006 Encounter for examination for normal comparison and control in clinical research program: Secondary | ICD-10-CM

## 2023-12-13 LAB — GENECONNECT MOLECULAR SCREEN: Genetic Analysis Overall Interpretation: NEGATIVE

## 2024-01-04 ENCOUNTER — Other Ambulatory Visit: Payer: Self-pay

## 2024-01-04 ENCOUNTER — Telehealth: Payer: Self-pay | Admitting: Family Medicine

## 2024-01-04 ENCOUNTER — Other Ambulatory Visit: Payer: Self-pay | Admitting: Family Medicine

## 2024-01-04 ENCOUNTER — Other Ambulatory Visit (HOSPITAL_COMMUNITY): Payer: Self-pay

## 2024-01-04 DIAGNOSIS — M170 Bilateral primary osteoarthritis of knee: Secondary | ICD-10-CM

## 2024-01-04 DIAGNOSIS — G8929 Other chronic pain: Secondary | ICD-10-CM

## 2024-01-04 MED ORDER — ZILRETTA 32 MG IX SRER
INTRA_ARTICULAR | 0 refills | Status: AC
  Filled 2024-01-04: qty 2, 84d supply, fill #0

## 2024-01-04 NOTE — Progress Notes (Signed)
 Specialty Pharmacy Refill Coordination Note  Kimberly Joseph is a 48 y.o. female assessed today regarding refills of clinic administered specialty medication(s) Triamcinolone  Acetonide (Zilretta )   Clinic requested Courier to Provider Office   Delivery date: 01/06/24   Verified address: [facility] at [address]   Medication will be filled on: 01/05/24

## 2024-01-04 NOTE — Telephone Encounter (Signed)
 Noted

## 2024-01-04 NOTE — Telephone Encounter (Addendum)
 Please re-auth Zilretta  injections for BILAT knee OA.   RX sent to [facility].

## 2024-01-04 NOTE — Telephone Encounter (Signed)
 Patient called and stated that the pharmacy told her her medication would be here by noon on Thursday. She has scheduled for next Tuesday and is on the wait list through mychart. FYI

## 2024-01-04 NOTE — Telephone Encounter (Signed)
 Patient called and would like to know when she had a shot last in both knees. She is in a lot of pain and is wanting to get another shot. Please advise.

## 2024-01-04 NOTE — Telephone Encounter (Signed)
 Last OV 10/25/23 Next OV not scheduled  Last refill 10/18/23 Qty #2/0

## 2024-01-05 ENCOUNTER — Other Ambulatory Visit: Payer: Self-pay

## 2024-01-05 NOTE — Telephone Encounter (Signed)
 Patient re-run for Zilretta . Case ID#: [id number]. Pending approval.

## 2024-01-05 NOTE — Telephone Encounter (Signed)
 Patient is scheduled for 12-9

## 2024-01-05 NOTE — Telephone Encounter (Signed)
 Patient needs an appointment. Medication was sent through [facility] per previous notes.  Zilretta  approved for bilateral knees.   No prior authorization, medical notes or referrals needed. Patient has a Fully [facility] plan with an effective date of 06/03/2023. Plan follows Medicaid guidelines. [employer] verified benefits for the patient's [facility] [place] [facility] plan. The payer states that the patient is covered at 100% of the Medicaid fee schedule for J3304 (Zilretta ) and CPT code 79388. There is no deductible or out of pocket maximum that applies to these services. Please contact your state Medicaid plan directly for allowable/reimbursement information. The patient has no liability other than an up to $4 copay for office visit. The above benefits represent the coverage for J3304 (Zilretta ) based on repeat administration. Plan renews on 02/03/2024. [employer] recommends that the provider resubmit for dates of service after 02/03/2024. Benefits for dates of service after 02/03/2024 may vary. As per the payer the provider can buy & bill if it is in the contract.   Ref #: [id number]

## 2024-01-06 ENCOUNTER — Other Ambulatory Visit: Payer: Self-pay

## 2024-01-06 NOTE — Telephone Encounter (Signed)
 Medication arrived today and appointment has been moved up to tomorrow.

## 2024-01-07 ENCOUNTER — Ambulatory Visit: Admitting: Family Medicine

## 2024-01-07 ENCOUNTER — Other Ambulatory Visit: Payer: Self-pay

## 2024-01-07 VITALS — BP 122/80 | HR 104 | Ht 66.0 in | Wt 333.0 lb

## 2024-01-07 DIAGNOSIS — G8929 Other chronic pain: Secondary | ICD-10-CM

## 2024-01-07 DIAGNOSIS — M25562 Pain in left knee: Secondary | ICD-10-CM | POA: Diagnosis not present

## 2024-01-07 DIAGNOSIS — M170 Bilateral primary osteoarthritis of knee: Secondary | ICD-10-CM

## 2024-01-07 DIAGNOSIS — M25561 Pain in right knee: Secondary | ICD-10-CM | POA: Diagnosis not present

## 2024-01-07 MED ORDER — TRIAMCINOLONE ACETONIDE 32 MG IX SRER
32.0000 mg | Freq: Once | INTRA_ARTICULAR | Status: AC
  Administered 2024-01-07: 32 mg via INTRA_ARTICULAR

## 2024-01-07 NOTE — Patient Instructions (Signed)
 Thank you for coming in today. Zilretta  injections for bilateral knees today.

## 2024-01-07 NOTE — Progress Notes (Signed)
 I, Claretha Schimke am a scribe for Dr. Artist Lloyd, MD.  Kimberly Joseph is a 48 y.o. female who presents to [facility] at [facility] today for exacerbation of her bilat knee pain. Pt was last seen by Dr. Lloyd on 10/25/23 and was given repeat bilat knee Zilretta  injections.   Today, pt reports here for knee injections today. Been doing ok since last visit.   Dx imaging: 08/25/22 R & L knee XR   Pertinent review of systems: No fevers or chills  Relevant historical information: Hypertension and obesity   Exam:  BP 122/80   Pulse (!) 104   Ht 5' 6 (1.676 m)   Wt (!) 333 lb (151 kg)   SpO2 100%   BMI 53.75 kg/m  General: Well Developed, well nourished, and in no acute distress.   MSK: Bilateral knees mild effusion normal motion    Lab and Radiology Results   Zilretta  injection bilateral knee Procedure: Real-time Ultrasound Guided Injection of right knee joint superior lateral patellar space Device: Philips Affiniti 50G Images permanently stored and available for review in PACS Verbal informed consent obtained.  Discussed risks and benefits of procedure. Warned about infection, hyperglycemia bleeding, damage to structures among others. Patient expresses understanding and agreement Time-out conducted.   Noted no overlying erythema, induration, or other signs of local infection.   Skin prepped in a sterile fashion.   Local anesthesia: Topical Ethyl chloride.   With sterile technique and under real time ultrasound guidance: Zilretta  32 mg injected into knee joint. Fluid seen entering the joint capsule.   Completed without difficulty   Advised to call if fevers/chills, erythema, induration, drainage, or persistent bleeding.   Images permanently stored and available for review in the ultrasound unit.  Impression: Technically successful ultrasound guided injection.  Procedure: Real-time Ultrasound Guided Injection of left knee joint superior lateral  patellar space Device: Philips Affiniti 50G Images permanently stored and available for review in PACS Verbal informed consent obtained.  Discussed risks and benefits of procedure. Warned about infection, hyperglycemia bleeding, damage to structures among others. Patient expresses understanding and agreement Time-out conducted.   Noted no overlying erythema, induration, or other signs of local infection.   Skin prepped in a sterile fashion.   Local anesthesia: Topical Ethyl chloride.   With sterile technique and under real time ultrasound guidance: Zilretta  32 mg injected into knee joint. Fluid seen entering the joint capsule.   Completed without difficulty   Advised to call if fevers/chills, erythema, induration, drainage, or persistent bleeding.   Images permanently stored and available for review in the ultrasound unit.  Impression: Technically successful ultrasound guided injection.  Lot number: [id number] Patient provided her own Zilretta  through pharmacy benefit     Assessment and Plan: 48 y.o. female with bilateral knee pain due to DJD.  Plan for Zilretta  injection today.  As patient provided her medicines to her pharmacy benefit no charge for medications today.   PDMP not reviewed this encounter. Orders Placed This Encounter  Procedures   US  LIMITED JOINT SPACE STRUCTURES LOW BILAT(NO LINKED CHARGES)    Reason for Exam (SYMPTOM  OR DIAGNOSIS REQUIRED):   knee pain    Preferred imaging location?:   [facility] ordered this encounter  Medications   Triamcinolone  Acetonide (ZILRETTA ) intra-articular injection 32 mg   Triamcinolone  Acetonide (ZILRETTA ) intra-articular injection 32 mg     Discussed warning signs or symptoms. Please see discharge instructions. Patient expresses understanding.  The above documentation has been reviewed and is accurate and complete Artist Lloyd, M.D.

## 2024-01-11 ENCOUNTER — Ambulatory Visit: Admitting: Family Medicine

## 2024-01-24 DIAGNOSIS — E66813 Obesity, class 3: Secondary | ICD-10-CM | POA: Diagnosis not present

## 2024-01-24 DIAGNOSIS — Z6841 Body Mass Index (BMI) 40.0 and over, adult: Secondary | ICD-10-CM | POA: Diagnosis not present

## 2024-01-24 DIAGNOSIS — G4733 Obstructive sleep apnea (adult) (pediatric): Secondary | ICD-10-CM | POA: Diagnosis not present

## 2024-01-24 DIAGNOSIS — E559 Vitamin D deficiency, unspecified: Secondary | ICD-10-CM | POA: Diagnosis not present

## 2024-01-24 DIAGNOSIS — R7303 Prediabetes: Secondary | ICD-10-CM | POA: Diagnosis not present

## 2024-02-02 ENCOUNTER — Other Ambulatory Visit: Payer: Self-pay | Admitting: Gastroenterology

## 2024-02-07 ENCOUNTER — Encounter (HOSPITAL_COMMUNITY): Payer: Self-pay | Admitting: Gastroenterology

## 2024-02-07 NOTE — Progress Notes (Signed)
 Attempted to obtain medical history for pre op call via telephone, unable to reach at this time. HIPAA compliant voicemail message left requesting return call to pre surgical testing department.

## 2024-02-18 NOTE — Progress Notes (Signed)
 Sent staff message to Dr Rosalie and Resa PEAK to resend colon prep instructions and prep for 1/20 procedure.

## 2024-02-22 ENCOUNTER — Ambulatory Visit (HOSPITAL_BASED_OUTPATIENT_CLINIC_OR_DEPARTMENT_OTHER): Payer: Self-pay | Admitting: Anesthesiology

## 2024-02-22 ENCOUNTER — Encounter (HOSPITAL_COMMUNITY): Payer: Self-pay | Admitting: Anesthesiology

## 2024-02-22 ENCOUNTER — Encounter (HOSPITAL_COMMUNITY): Payer: Self-pay | Admitting: Gastroenterology

## 2024-02-22 ENCOUNTER — Encounter (HOSPITAL_COMMUNITY)
Admission: RE | Disposition: A | Discharge status: Home / Self Care | Payer: Self-pay | Source: Home / Self Care | Attending: Gastroenterology

## 2024-02-22 ENCOUNTER — Other Ambulatory Visit: Payer: Self-pay

## 2024-02-22 ENCOUNTER — Ambulatory Visit (HOSPITAL_COMMUNITY)
Admission: RE | Admit: 2024-02-22 | Discharge: 2024-02-22 | Disposition: A | Discharge status: Home / Self Care | Attending: Gastroenterology | Admitting: Gastroenterology

## 2024-02-22 DIAGNOSIS — Z1211 Encounter for screening for malignant neoplasm of colon: Secondary | ICD-10-CM | POA: Insufficient documentation

## 2024-02-22 DIAGNOSIS — I10 Essential (primary) hypertension: Secondary | ICD-10-CM

## 2024-02-22 MED ORDER — SODIUM CHLORIDE 0.9 % IV SOLN: INTRAVENOUS | Status: DC | PRN

## 2024-02-22 MED ORDER — PROPOFOL 500 MG/50ML IV EMUL
INTRAVENOUS | Status: DC | PRN
  Administered 2024-02-22: 130 ug/kg/min via INTRAVENOUS

## 2024-02-22 MED ORDER — PROPOFOL 10 MG/ML IV BOLUS
INTRAVENOUS | Status: DC | PRN
  Administered 2024-02-22: 20 mg via INTRAVENOUS
  Administered 2024-02-22 (×2): 30 mg via INTRAVENOUS

## 2024-02-22 MED ORDER — LIDOCAINE 2% (20 MG/ML) 5 ML SYRINGE
INTRAMUSCULAR | Status: DC | PRN
  Administered 2024-02-22: 100 mg via INTRAVENOUS

## 2024-02-22 MED ORDER — SODIUM CHLORIDE 0.9 % IV SOLN: INTRAVENOUS | Status: DC

## 2024-02-22 MED ORDER — PROPOFOL 1000 MG/100ML IV EMUL
INTRAVENOUS | Status: AC
  Filled 2024-02-22: qty 100

## 2024-02-22 NOTE — Anesthesia Procedure Notes (Signed)
 Procedure Name: MAC Date/Time: 02/22/2024 8:36 AM  Performed by: Franchot Delon RAMAN, CRNAPre-anesthesia Checklist: Patient identified, Emergency Drugs available, Suction available and Patient being monitored Patient Re-evaluated:Patient Re-evaluated prior to induction Oxygen Delivery Method: Nasal cannula Placement Confirmation: positive ETCO2 Dental Injury: Teeth and Oropharynx as per pre-operative assessment  Comments: Optiflow [place]

## 2024-02-22 NOTE — Transfer of Care (Signed)
 Immediate Anesthesia Transfer of Care Note  Patient: Kimberly Joseph  Procedure(s) Performed: COLONOSCOPY  Patient Location: Endoscopy Unit  Anesthesia Type:MAC  Level of Consciousness: awake, alert , oriented, and patient cooperative  Airway & Oxygen Therapy: Patient Spontanous Breathing and Patient connected to face mask oxygen  Post-op Assessment: Report given to RN and Post -op Vital signs reviewed and stable  Post vital signs: Reviewed and stable  Last Vitals:  Vitals Value Taken Time  BP 128/71 02/22/24 09:10  Temp 36.2 C 02/22/24 09:07  Pulse 89 02/22/24 09:11  Resp 18 02/22/24 09:11  SpO2 98 % 02/22/24 09:11  Vitals shown include unfiled device data.  Last Pain:  Vitals:   02/22/24 0910  TempSrc:   PainSc: 0-No pain         Complications: There were no known notable events for this encounter.

## 2024-02-22 NOTE — Anesthesia Preprocedure Evaluation (Signed)
"                                    Anesthesia Evaluation  Patient identified by MRN, date of birth, ID band Patient awake    Reviewed: Allergy & Precautions, NPO status , Patient's Chart, lab work & pertinent test results, reviewed documented beta blocker date and time   History of Anesthesia Complications Negative for: history of anesthetic complications  Airway Mallampati: III  TM Distance: >3 FB     Dental no notable dental hx.    Pulmonary neg COPD   breath sounds clear to auscultation       Cardiovascular hypertension, (-) CAD and (-) Past MI  Rhythm:Regular Rate:Normal     Neuro/Psych neg Seizures    GI/Hepatic ,GERD  ,,(+) neg Cirrhosis        Endo/Other    Class 4 obesity  Renal/GU Renal disease     Musculoskeletal  (+) Arthritis , Osteoarthritis,    Abdominal   Peds  Hematology   Anesthesia Other Findings   Reproductive/Obstetrics                              Anesthesia Physical Anesthesia Plan  ASA: 3  Anesthesia Plan: MAC   Post-op Pain Management:    Induction:   PONV Risk Score and Plan: 1 and Propofol  infusion  Airway Management Planned: Nasal Cannula  Additional Equipment:   Intra-op Plan:   Post-operative Plan:   Informed Consent: I have reviewed the patients History and Physical, chart, labs and discussed the procedure including the risks, benefits and alternatives for the proposed anesthesia with the patient or authorized representative who has indicated his/her understanding and acceptance.     Dental advisory given  Plan Discussed with: CRNA  Anesthesia Plan Comments:         Anesthesia Quick Evaluation  "

## 2024-02-22 NOTE — Op Note (Signed)
 [facility] Patient Name: Kimberly Joseph Procedure Date: 02/22/2024 MRN: [id number] Attending MD: Oliva Boots , MD, [id number] Date of Birth: [birth year] CSN: [id number] Age: 49 Admit Type: Outpatient Procedure:                Colonoscopy Indications:              Screening for colorectal malignant neoplasm, This                            is the patient's first colonoscopy Providers:                Oliva Boots, MD, Willy Hummer, RN, Curtistine Bishop, Technician Referring MD:              Medicines:                Monitored Anesthesia Care Complications:            No immediate complications. Estimated Blood Loss:     Estimated blood loss: none. Procedure:                Pre-Anesthesia Assessment:                           - Prior to the procedure, a History and Physical                            was performed, and patient medications and                            allergies were reviewed. The patient's tolerance of                            previous anesthesia was also reviewed. The risks                            and benefits of the procedure and the sedation                            options and risks were discussed with the patient.                            All questions were answered, and informed consent                            was obtained. Prior Anticoagulants: The patient has                            taken no anticoagulant or antiplatelet agents. ASA                            Grade Assessment: III - A patient with severe  systemic disease. After reviewing the risks and                            benefits, the patient was deemed in satisfactory                            condition to undergo the procedure.                           After obtaining informed consent, the colonoscope                            was passed under direct vision. Throughout the                            procedure, the  patient's blood pressure, pulse, and                            oxygen saturations were monitored continuously. The                            CF-HQ190L ([id number] Olympus colonoscope was                            introduced through the anus and advanced to the the                            cecum, identified by appendiceal orifice and                            ileocecal valve. The colonoscopy was performed                            without difficulty. The patient tolerated the                            procedure well. The quality of the bowel                            preparation was adequate. The ileocecal valve,                            appendiceal orifice, and rectum were photographed. Scope In: 8:46:54 AM Scope Out: 8:59:14 AM Scope Withdrawal Time: 0 hours 9 minutes 5 seconds  Total Procedure Duration: 0 hours 12 minutes 20 seconds  Findings:      The colon (entire examined portion) appeared normal.      The retroflexed view of the distal rectum and anal verge was normal and       showed no anal or rectal abnormalities. Impression:               - The entire examined colon is normal.                           - No specimens collected. Moderate Sedation:  Not Applicable - Patient had care per Anesthesia. Recommendation:           - Patient has a contact number available for                            emergencies. The signs and symptoms of potential                            delayed complications were discussed with the                            patient. Return to normal activities tomorrow.                            Written discharge instructions were provided to the                            patient.                           - Soft diet today.                           - Continue present medications.                           - Repeat colonoscopy in 10 years for screening                            purposes.                           - Return to GI office PRN.                            - Telephone GI clinic if symptomatic PRN. Procedure Code(s):        --- Professional ---                           [phone], Colonoscopy, flexible; diagnostic, including                            collection of specimen(s) by brushing or washing,                            when performed (separate procedure) Diagnosis Code(s):        --- Professional ---                           Z12.11, Encounter for screening for malignant                            neoplasm of colon CPT copyright 2022 [employer]. All rights reserved. The codes documented in this report are preliminary and upon coder review may  be revised to meet current compliance requirements. Oliva Boots, MD 02/22/2024 9:04:12 AM This report has been signed  electronically. Number of Addenda: 0

## 2024-02-22 NOTE — Anesthesia Postprocedure Evaluation (Signed)
"   Anesthesia Post Note  Patient: Kimberly Joseph  Procedure(s) Performed: COLONOSCOPY     Patient location during evaluation: PACU Anesthesia Type: MAC Level of consciousness: awake and alert Pain management: pain level controlled Vital Signs Assessment: post-procedure vital signs reviewed and stable Respiratory status: spontaneous breathing, nonlabored ventilation, respiratory function stable and patient connected to nasal cannula oxygen Cardiovascular status: stable and blood pressure returned to baseline Postop Assessment: no apparent nausea or vomiting Anesthetic complications: no   There were no known notable events for this encounter.  Last Vitals:  Vitals:   02/22/24 0910 02/22/24 0920  BP: 128/71 133/69  Pulse: 91 90  Resp: (!) 30 (!) 21  Temp:    SpO2: 96% 100%    Last Pain:  Vitals:   02/22/24 0920  TempSrc:   PainSc: 0-No pain                 [name] Cornea      "

## 2024-02-22 NOTE — Progress Notes (Signed)
 Kimberly Joseph 8:26 AM  Subjective: Patient doing well tolerated her prep no GI problems and no new medical problems since she was seen recently in our office and we rediscussed her procedure and answered all of her questions And her family history is negative she is not on any aspirin or blood thinners Objective: Signs stable afebrile no acute distress exam please see preassessment evaluation  Assessment: Colon screening  Plan: Okay to proceed with colonoscopy with anesthesia assistance  [facility]  office [phone] After 5PM or if no answer call [phone]

## 2024-02-22 NOTE — Discharge Instructions (Addendum)
 Call if GI question or problem otherwise soft solids first meal today and slowly advance diet follow-up as needed and repeat colonoscopy in 10 years  YOU HAD AN ENDOSCOPIC PROCEDURE TODAY: Refer to the procedure report and other information in the discharge instructions given to you for any specific questions about what was found during the examination. If this information does not answer your questions, please call the [facility] office at [phone] to clarify.   YOU SHOULD EXPECT: Some feelings of bloating in the abdomen. Passage of more gas than usual. Walking can help get rid of the air that was put into your GI tract during the procedure and reduce the bloating. If you had a lower endoscopy (such as a colonoscopy or flexible sigmoidoscopy) you may notice spotting of blood in your stool or on the toilet paper. Some abdominal soreness may be present for a day or two, also.  DIET: Your first meal following the procedure should be a light meal and then it is ok to progress to your normal diet. A half-sandwich or bowl of soup is an example of a good first meal. Heavy or fried foods are harder to digest and may make you feel nauseous or bloated. Drink plenty of fluids but you should avoid alcoholic beverages for 24 hours.   ACTIVITY: Your care partner should take you home directly after the procedure. You should plan to take it easy, moving slowly for the rest of the day. You can resume normal activity the day after the procedure however YOU SHOULD NOT DRIVE, use power tools, machinery or perform tasks that involve climbing or major physical exertion for 24 hours (because of the sedation medicines used during the test).   SYMPTOMS TO REPORT IMMEDIATELY: A gastroenterologist can be reached at any hour. Please call [phone]  for any of the following symptoms:  Following lower endoscopy (colonoscopy, flexible sigmoidoscopy) Excessive amounts of blood in the stool  Significant tenderness, worsening of  abdominal pains  Swelling of the abdomen that is new, acute  Fever of 100 or higher    FOLLOW UP:  If any biopsies were taken you will be contacted by phone or by letter within the next 1-3 weeks. Call [phone]  if you have not heard about the biopsies in 3 weeks.  Please also call with any specific questions about appointments or follow up tests.

## 2024-02-25 ENCOUNTER — Encounter (HOSPITAL_COMMUNITY): Payer: Self-pay | Admitting: Gastroenterology
